# Patient Record
Sex: Male | Born: 1955 | Race: White | Hispanic: No | Marital: Married | Smoking: Former smoker
Health system: Southern US, Community
[De-identification: ages and names within clinical notes are randomized; demographics above are authoritative.]

## PROBLEM LIST (undated history)

## (undated) DIAGNOSIS — G4733 Obstructive sleep apnea (adult) (pediatric): Secondary | ICD-10-CM

## (undated) DIAGNOSIS — E669 Obesity, unspecified: Secondary | ICD-10-CM

## (undated) DIAGNOSIS — I779 Disorder of arteries and arterioles, unspecified: Secondary | ICD-10-CM

## (undated) DIAGNOSIS — N529 Male erectile dysfunction, unspecified: Secondary | ICD-10-CM

## (undated) DIAGNOSIS — I2510 Atherosclerotic heart disease of native coronary artery without angina pectoris: Secondary | ICD-10-CM

## (undated) DIAGNOSIS — K759 Inflammatory liver disease, unspecified: Secondary | ICD-10-CM

## (undated) DIAGNOSIS — I77810 Thoracic aortic ectasia: Secondary | ICD-10-CM

## (undated) DIAGNOSIS — J302 Other seasonal allergic rhinitis: Secondary | ICD-10-CM

## (undated) DIAGNOSIS — E7800 Pure hypercholesterolemia, unspecified: Secondary | ICD-10-CM

## (undated) DIAGNOSIS — M1990 Unspecified osteoarthritis, unspecified site: Secondary | ICD-10-CM

## (undated) DIAGNOSIS — I10 Essential (primary) hypertension: Secondary | ICD-10-CM

## (undated) HISTORY — DX: Male erectile dysfunction, unspecified: N52.9

## (undated) HISTORY — DX: Obesity, unspecified: E66.9

## (undated) HISTORY — PX: OTHER SURGICAL HISTORY: SHX169

## (undated) HISTORY — DX: Thoracic aortic ectasia: I77.810

## (undated) HISTORY — DX: Atherosclerotic heart disease of native coronary artery without angina pectoris: I25.10

## (undated) HISTORY — DX: Obstructive sleep apnea (adult) (pediatric): G47.33

---
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
## 1996-03-15 HISTORY — PX: NASAL SEPTUM SURGERY: SHX37

## 1997-03-15 HISTORY — PX: CERVICAL FUSION: SHX112

## 1998-10-17 ENCOUNTER — Encounter: Payer: Self-pay | Admitting: Emergency Medicine

## 1998-10-17 ENCOUNTER — Emergency Department (HOSPITAL_COMMUNITY): Admission: EM | Admit: 1998-10-17 | Discharge: 1998-10-17 | Payer: Self-pay | Admitting: Emergency Medicine

## 1999-02-26 ENCOUNTER — Ambulatory Visit (HOSPITAL_COMMUNITY): Admission: RE | Admit: 1999-02-26 | Discharge: 1999-02-26 | Payer: Self-pay | Admitting: Family Medicine

## 1999-02-26 ENCOUNTER — Encounter: Payer: Self-pay | Admitting: Family Medicine

## 1999-03-30 ENCOUNTER — Encounter: Admission: RE | Admit: 1999-03-30 | Discharge: 1999-03-30 | Payer: Self-pay | Admitting: Family Medicine

## 1999-03-30 ENCOUNTER — Encounter: Payer: Self-pay | Admitting: Family Medicine

## 1999-06-17 ENCOUNTER — Inpatient Hospital Stay (HOSPITAL_COMMUNITY): Admission: RE | Admit: 1999-06-17 | Discharge: 1999-06-17 | Payer: Self-pay | Admitting: Neurosurgery

## 1999-06-17 ENCOUNTER — Encounter: Payer: Self-pay | Admitting: Neurosurgery

## 1999-07-09 ENCOUNTER — Ambulatory Visit (HOSPITAL_COMMUNITY): Admission: RE | Admit: 1999-07-09 | Discharge: 1999-07-09 | Payer: Self-pay | Admitting: Neurosurgery

## 1999-07-09 ENCOUNTER — Encounter: Payer: Self-pay | Admitting: Neurosurgery

## 2002-12-14 ENCOUNTER — Ambulatory Visit (HOSPITAL_COMMUNITY): Admission: RE | Admit: 2002-12-14 | Discharge: 2002-12-14 | Payer: Self-pay | Admitting: Gastroenterology

## 2012-04-10 ENCOUNTER — Encounter (HOSPITAL_COMMUNITY): Payer: Self-pay | Admitting: *Deleted

## 2012-04-10 NOTE — Pre-Procedure Instructions (Signed)
Your procedure is scheduled on:Wednesdat, 05, 2014 Report to [facility] Admitting at: Call this number if you have problems morning of your procedure:[phone]  Follow all bowel prep instructions per your doctor's orders.  Do not eat or drink anything after midnight the night before your procedure. You may brush your teeth, rinse out your mouth, but no water, no food, no chewing gum, no mints, no candies, no chewing tobacco.     Take these medicines the morning of your procedure with A SIP OF WATER:nasall spray for allergy  Please make arrangements for a responsible person to drive you home after the procedure. You cannot go home by cab/taxi. We recommend you have someone with you at home the first 24 hours after your procedure. Driver for procedure is wife Toniann Fail [phone]  LEAVE ALL VALUABLES, JEWELRY, BILLFOLD AT HOME.  NO DENTURES, CONTACT LENSES ALLOWED IN THE ENDOSCOPY ROOM.   YOU MAY WEAR DEODORANT, PLEASE REMOVE ALL JEWELRY, WATCHES RINGS, BODY PIERCINGS AND LEAVE AT HOME.   WOMEN: NO MAKE-UP, LOTIONS PERFUMES

## 2012-04-18 ENCOUNTER — Encounter (HOSPITAL_COMMUNITY): Payer: Self-pay | Admitting: Pharmacy Technician

## 2012-04-19 ENCOUNTER — Ambulatory Visit (HOSPITAL_COMMUNITY): Payer: BC Managed Care – PPO | Admitting: Anesthesiology

## 2012-04-19 ENCOUNTER — Encounter (HOSPITAL_COMMUNITY): Payer: Self-pay | Admitting: *Deleted

## 2012-04-19 ENCOUNTER — Encounter (HOSPITAL_COMMUNITY): Payer: Self-pay | Admitting: Anesthesiology

## 2012-04-19 ENCOUNTER — Encounter (HOSPITAL_COMMUNITY)
Admission: RE | Disposition: A | Discharge status: Home / Self Care | Payer: Self-pay | Source: Ambulatory Visit | Attending: Gastroenterology

## 2012-04-19 ENCOUNTER — Ambulatory Visit (HOSPITAL_COMMUNITY)
Admission: RE | Admit: 2012-04-19 | Discharge: 2012-04-19 | Disposition: A | Discharge status: Home / Self Care | Payer: BC Managed Care – PPO | Source: Ambulatory Visit | Attending: Gastroenterology | Admitting: Gastroenterology

## 2012-04-19 DIAGNOSIS — Z800 Family history of malignant neoplasm of digestive organs: Secondary | ICD-10-CM | POA: Insufficient documentation

## 2012-04-19 DIAGNOSIS — D126 Benign neoplasm of colon, unspecified: Secondary | ICD-10-CM | POA: Insufficient documentation

## 2012-04-19 DIAGNOSIS — K648 Other hemorrhoids: Secondary | ICD-10-CM | POA: Insufficient documentation

## 2012-04-19 DIAGNOSIS — K644 Residual hemorrhoidal skin tags: Secondary | ICD-10-CM | POA: Insufficient documentation

## 2012-04-19 HISTORY — DX: Pure hypercholesterolemia, unspecified: E78.00

## 2012-04-19 HISTORY — PX: HOT HEMOSTASIS: SHX5433

## 2012-04-19 HISTORY — DX: Other seasonal allergic rhinitis: J30.2

## 2012-04-19 HISTORY — DX: Inflammatory liver disease, unspecified: K75.9

## 2012-04-19 HISTORY — PX: COLONOSCOPY WITH PROPOFOL: SHX5780

## 2012-04-19 SURGERY — COLONOSCOPY WITH PROPOFOL
Anesthesia: Monitor Anesthesia Care

## 2012-04-19 MED ORDER — PROPOFOL INFUSION 10 MG/ML OPTIME
INTRAVENOUS | Status: DC | PRN
  Administered 2012-04-19: 180 ug/kg/min via INTRAVENOUS

## 2012-04-19 MED ORDER — SODIUM CHLORIDE 0.9 % IV SOLN: INTRAVENOUS | Status: DC

## 2012-04-19 MED ORDER — MIDAZOLAM HCL 5 MG/5ML IJ SOLN
INTRAMUSCULAR | Status: DC | PRN
  Administered 2012-04-19: 2 mg via INTRAVENOUS

## 2012-04-19 MED ORDER — FENTANYL CITRATE 0.05 MG/ML IJ SOLN
INTRAMUSCULAR | Status: DC | PRN
  Administered 2012-04-19 (×2): 50 ug via INTRAVENOUS

## 2012-04-19 MED ORDER — LACTATED RINGERS IV SOLN
INTRAVENOUS | Status: DC
  Administered 2012-04-19: 1000 mL via INTRAVENOUS

## 2012-04-19 SURGICAL SUPPLY — 21 items

## 2012-04-19 NOTE — Anesthesia Preprocedure Evaluation (Signed)
Anesthesia Evaluation  Patient identified by MRN, date of birth, ID band Patient awake    Reviewed: Allergy & Precautions, H&P , NPO status , Patient's Chart, lab work & pertinent test results, reviewed documented beta blocker date and time   Airway Mallampati: II TM Distance: >3 FB Neck ROM: full    Dental No notable dental hx.    Pulmonary neg pulmonary ROS,  breath sounds clear to auscultation  Pulmonary exam normal       Cardiovascular Exercise Tolerance: Good negative cardio ROS  Rhythm:regular Rate:Normal     Neuro/Psych negative neurological ROS  negative psych ROS   GI/Hepatic negative GI ROS, (+) Hepatitis -  Endo/Other  negative endocrine ROS  Renal/GU negative Renal ROS  negative genitourinary   Musculoskeletal   Abdominal   Peds  Hematology negative hematology ROS (+)   Anesthesia Other Findings   Reproductive/Obstetrics negative OB ROS                           Anesthesia Physical Anesthesia Plan  ASA: II  Anesthesia Plan: MAC   Post-op Pain Management:    Induction: Intravenous  Airway Management Planned:   Additional Equipment:   Intra-op Plan:   Post-operative Plan: Extubation in OR  Informed Consent: I have reviewed the patients History and Physical, chart, labs and discussed the procedure including the risks, benefits and alternatives for the proposed anesthesia with the patient or authorized representative who has indicated his/her understanding and acceptance.   Dental Advisory Given  Plan Discussed with: CRNA  Anesthesia Plan Comments:         Anesthesia Quick Evaluation

## 2012-04-19 NOTE — Op Note (Signed)
[facility] [address] [place], [address]   COLONOSCOPY PROCEDURE REPORT  PATIENT: Daniel Humphrey, Daniel Humphrey  MR#: [id number] BIRTHDATE: 09-24-1955 , 56  yrs. old GENDER: Male ENDOSCOPIST: [name], MD REFERRED HQ:[id number] Harris, M.D. PROCEDURE DATE:  04/19/2012 PROCEDURE:   Colonoscopy with tissue ablation, Colonoscopy with biopsy and snare polypectomy, and Colonoscopy with hot biopsy/bipolar ASA CLASS:   Class I INDICATIONS:Patient's immediate family history of colon cancer and Patient's personal history of adenomatous colon polyps difficult to remove. MEDICATIONS: propofol (Diprivan) 550mg  IV, Fentanyl 100 mcg IV, and Versed 2 mg IV  DESCRIPTION OF PROCEDURE:   After the risks benefits and alternatives of the procedure were thoroughly explained, informed consent was obtained.  The [id number])  endoscope was introduced through the anus and advanced to the cecum, which was identified by both the appendix and ileocecal valve , limited by No adverse events experienced.   The quality of the prep was adequate. .  The instrument was then slowly withdrawn as the colon was fully examined.to advanced to the cecum required some abdominal pressure and no obvious abnormality was seen on insertion and one fold behind the ileocecal valve some residual polyp was seen and was snared x3 and then hot biopsied x4 and then we used the APC in the customary fashion without any obvious residual polyp seen. On the ileocecal valve was probably a hyperplastic polyp which was cold biopsied x4 and put in a separate container and the scope was then slowly withdrawn and no additional findings was seen and once back in the rectum anal rectal pull-through in retroflexion was done and the scope was straightened and advanced a short ways up the left side of the colon air was suctioned scope removed patient tolerated the procedure well there was no obvious immediate  complication      FINDINGS:  1 . Internal/external small hemorrhoids 2. Minimal residual ascending proximal polyp status post snare hot biopsy and APC 3. Small ileocecal valve probable hyperplastic polyp status post cold biopsy 4. Otherwise within normal limits to the cecum COMPLICATIONS: none  IMPRESSION:  above  RECOMMENDATIONS: hold aspirin for one week await pathology call when necessary repeat colon screening pending pathology probably 3 years unless all pathology hyperplastic in which case okay to wait 5 year   _______________________________ eSigned:  [name], MD 04/19/2012 9:55 AM   DG:[id number] Tiburcio Pea, MD  PATIENT NAME:  Daniel Humphrey MR#: [id number]

## 2012-04-19 NOTE — Anesthesia Postprocedure Evaluation (Signed)
  Anesthesia Post-op Note  Patient: Daniel Humphrey  Procedure(s) Performed: Procedure(s) (LRB): COLONOSCOPY WITH PROPOFOL (N/A) HOT HEMOSTASIS (ARGON PLASMA COAGULATION/BICAP) (N/A)  Patient Location: PACU  Anesthesia Type: MAC  Level of Consciousness: awake and alert   Airway and Oxygen Therapy: Patient Spontanous Breathing  Post-op Pain: mild  Post-op Assessment: Post-op Vital signs reviewed, Patient's Cardiovascular Status Stable, Respiratory Function Stable, Patent Airway and No signs of Nausea or vomiting  Last Vitals:  Filed Vitals:   04/19/12 1020  BP: 134/95  Pulse:   Temp:   Resp:     Post-op Vital Signs: stable   Complications: No apparent anesthesia complications

## 2012-04-19 NOTE — Progress Notes (Signed)
Daniel Humphrey 8:43 AM  Subjective: Patient without any new GI or medical problems or complaints since we've seen him Objective: Vital signs stable afebrile no acute distress exam please see pre-assessment evaluation Assessment: Difficult to remove colonic polyp Plan: Okay to proceed with colonoscopy today  Daniel Humphrey E

## 2012-04-19 NOTE — Transfer of Care (Signed)
Immediate Anesthesia Transfer of Care Note  Patient: Daniel Humphrey  Procedure(s) Performed: Procedure(s) (LRB) with comments: COLONOSCOPY WITH PROPOFOL (N/A) HOT HEMOSTASIS (ARGON PLASMA COAGULATION/BICAP) (N/A)  Patient Location: PACU  Anesthesia Type:MAC  Level of Consciousness: awake, alert , oriented and patient cooperative  Airway & Oxygen Therapy: Patient Spontanous Breathing and Patient connected to face mask oxygen  Post-op Assessment: Report given to PACU RN and Post -op Vital signs reviewed and stable  Post vital signs: Reviewed and stable  Complications: No apparent anesthesia complications

## 2012-04-20 ENCOUNTER — Encounter (HOSPITAL_COMMUNITY): Payer: Self-pay | Admitting: Gastroenterology

## 2012-12-11 ENCOUNTER — Other Ambulatory Visit: Payer: Self-pay | Admitting: Interventional Cardiology

## 2012-12-11 DIAGNOSIS — I359 Nonrheumatic aortic valve disorder, unspecified: Secondary | ICD-10-CM

## 2012-12-11 DIAGNOSIS — R0602 Shortness of breath: Secondary | ICD-10-CM

## 2012-12-21 ENCOUNTER — Ambulatory Visit (HOSPITAL_COMMUNITY): Payer: BC Managed Care – PPO | Attending: Cardiovascular Disease | Admitting: Radiology

## 2012-12-21 VITALS — BP 143/81 | HR 71 | Ht 69.0 in | Wt 216.0 lb

## 2012-12-21 DIAGNOSIS — R5381 Other malaise: Secondary | ICD-10-CM | POA: Insufficient documentation

## 2012-12-21 DIAGNOSIS — R002 Palpitations: Secondary | ICD-10-CM | POA: Insufficient documentation

## 2012-12-21 DIAGNOSIS — R42 Dizziness and giddiness: Secondary | ICD-10-CM | POA: Insufficient documentation

## 2012-12-21 DIAGNOSIS — R0602 Shortness of breath: Secondary | ICD-10-CM

## 2012-12-21 DIAGNOSIS — R0609 Other forms of dyspnea: Secondary | ICD-10-CM | POA: Insufficient documentation

## 2012-12-21 DIAGNOSIS — R9439 Abnormal result of other cardiovascular function study: Secondary | ICD-10-CM

## 2012-12-21 DIAGNOSIS — R0600 Dyspnea, unspecified: Secondary | ICD-10-CM

## 2012-12-21 DIAGNOSIS — R0989 Other specified symptoms and signs involving the circulatory and respiratory systems: Secondary | ICD-10-CM | POA: Insufficient documentation

## 2012-12-21 MED ORDER — REGADENOSON 0.4 MG/5ML IV SOLN
0.4000 mg | Freq: Once | INTRAVENOUS | Status: AC
  Administered 2012-12-21: 0.4 mg via INTRAVENOUS

## 2012-12-21 MED ORDER — TECHNETIUM TC 99M SESTAMIBI GENERIC - CARDIOLITE
30.0000 | Freq: Once | INTRAVENOUS | Status: AC | PRN
  Administered 2012-12-21: 30 via INTRAVENOUS

## 2012-12-21 MED ORDER — TECHNETIUM TC 99M SESTAMIBI GENERIC - CARDIOLITE
10.0000 | Freq: Once | INTRAVENOUS | Status: AC | PRN
  Administered 2012-12-21: 10 via INTRAVENOUS

## 2012-12-21 NOTE — Progress Notes (Signed)
[facility] NUCLEAR MED [address] [place], [place] [address] [phone]    Cardiology Nuclear Med Yehuda Savannah Daniel Humphrey is a 57 y.o. male     MRN : [id number]     DOB: 02/01/1956  Procedure Date: 12/21/2012  Nuclear Med Background Indication for Stress Test:  Evaluation for Ischemia and Abnormal GXT History:  12/07/12 Echo:EF=60%; 12/08/12 OZH:[id number] at end of recovery Cardiac Risk Factors: Family History - CAD, History of Smoking and Lipids  Symptoms:  Dizziness, DOE, Fatigue and Palpitations   Nuclear Pre-Procedure Caffeine/Decaff Intake:  None NPO After: 7:00am   Lungs:  Clear. O2 Sat: 96% on room air. IV 0.9% NS with Angio Cath:  22g  IV Site: R Antecubital  IV Started by:  Bonnita Levan, RN  Chest Size (in):  44 Cup Size: n/a  Height: 5\' 9"  (1.753 m)  Weight:  216 lb (97.977 kg)  BMI:  Body mass index is 31.88 kg/(m^2). Tech Comments:  N/A    Nuclear Med Study 1 or 2 day study: 1 day  Stress Test Type:  Lexiscan  Reading MD: Lance Muss, MD  Order Authorizing Provider:  Lance Muss, MD  Resting Radionuclide: Technetium 19m Sestamibi  Resting Radionuclide Dose: 11.0 mCi   Stress Radionuclide:  Technetium 27m Sestamibi  Stress Radionuclide Dose: 33.0 mCi           Stress Protocol Rest HR: 71 Stress HR: 126  Rest BP: 143/81 Stress BP: 157/75  Exercise Time (min): 2:00 METS: n/a   Predicted Max HR: 165 bpm % Max HR: 76.36 bpm Rate Pressure Product: 96295   Dose of Adenosine (mg):  n/a Dose of Lexiscan: 0.4 mg  Dose of Atropine (mg): n/a Dose of Dobutamine: n/a mcg/kg/min (at max HR)  Stress Test Technologist: Smiley Houseman, CMA-N  Nuclear Technologist:  Domenic Polite, CNMT     Rest Procedure:  Myocardial perfusion imaging was performed at rest 45 minutes following the intravenous administration of Technetium 39m Sestamibi.  Rest ECG: NSR - Normal EKG  Stress Procedure:  The patient received IV Lexiscan 0.4 mg over  15-seconds with concurrent low level exercise and then Technetium 25m Sestamibi was injected at 30-seconds while the patient continued walking one more minute.  Quantitative spect images were obtained after a 45-minute delay.  Stress ECG: Significant ST abnormalities consistent with ischemia.  QPS Raw Data Images:  Mild diaphragmatic attenuation; normal left ventricular size. Stress Images:  Normal homogeneous uptake in all areas of the myocardium. Rest Images:  Comparison with the stress images reveals no significant change. Subtraction (SDS):  No evidence of ischemia. Transient Ischemic Dilatation (Normal <1.22):  n/a Lung/Heart Ratio (Normal <0.45):  0.34  Quantitative Gated Spect Images QGS EDV:  117 ml QGS ESV:  54 ml  Impression Exercise Capacity:  Lexiscan with low level exercise. BP Response:  Normal blood pressure response. Clinical Symptoms:  There is dyspnea. ECG Impression:  Significant ST abnormalities consistent with ischemia.Inferior leads. Likely false positive ECG. Comparison with Prior Nuclear Study: No previous nuclear study performed  Overall Impression:  Normal stress nuclear study.  LV Ejection Fraction: 54%.  LV Wall Motion:  NL LV Function; NL Wall Motion   [name] MD, [facility]

## 2013-06-18 ENCOUNTER — Ambulatory Visit
Admission: RE | Admit: 2013-06-18 | Discharge: 2013-06-18 | Disposition: A | Discharge status: Home / Self Care | Payer: BC Managed Care – PPO | Source: Ambulatory Visit | Attending: Interventional Cardiology | Admitting: Interventional Cardiology

## 2013-06-18 DIAGNOSIS — R0602 Shortness of breath: Secondary | ICD-10-CM

## 2013-06-18 DIAGNOSIS — I359 Nonrheumatic aortic valve disorder, unspecified: Secondary | ICD-10-CM

## 2013-06-18 MED ORDER — GADOBENATE DIMEGLUMINE 529 MG/ML IV SOLN
20.0000 mL | Freq: Once | INTRAVENOUS | Status: AC | PRN
  Administered 2013-06-18: 20 mL via INTRAVENOUS

## 2013-06-22 ENCOUNTER — Telehealth: Payer: Self-pay | Admitting: Interventional Cardiology

## 2013-06-22 DIAGNOSIS — I77810 Thoracic aortic ectasia: Secondary | ICD-10-CM

## 2013-06-22 NOTE — Telephone Encounter (Signed)
New message    Pt want chest CT results.  He had it at [facility].  He thinks it was a CT.

## 2013-06-22 NOTE — Telephone Encounter (Signed)
Lm letting pt know that I am waiting on final interpretation of MRA from Dr. Irish Lack and once I have final interp I will call him.

## 2013-06-22 NOTE — Telephone Encounter (Signed)
To Dr. Varanasi, please advise.  

## 2013-06-26 NOTE — Telephone Encounter (Signed)
See results note for MRA.

## 2013-06-26 NOTE — Addendum Note (Signed)
Addended byUlla Potash H on: 06/26/2013 08:24 AM   Modules accepted: Orders

## 2013-12-26 ENCOUNTER — Ambulatory Visit (HOSPITAL_COMMUNITY): Admission: RE | Admit: 2013-12-26 | Payer: BC Managed Care – PPO | Source: Ambulatory Visit

## 2013-12-26 ENCOUNTER — Telehealth: Payer: Self-pay | Admitting: Interventional Cardiology

## 2013-12-26 NOTE — Addendum Note (Signed)
Addended byUlla Potash H on: 12/26/2013 01:07 PM   Modules accepted: Orders

## 2013-12-26 NOTE — Telephone Encounter (Signed)
Spoke with pt and he was unaware of his MRA Chest appointment today. He has appt with Dr. Irish Lack tomorrow and we will reschedule then.

## 2013-12-26 NOTE — Telephone Encounter (Signed)
lmtrc

## 2013-12-26 NOTE — Telephone Encounter (Signed)
New message          Pt would like amy to give him a call / pt did not disclose any other info

## 2013-12-27 ENCOUNTER — Encounter: Payer: Self-pay | Admitting: Interventional Cardiology

## 2013-12-27 ENCOUNTER — Ambulatory Visit (INDEPENDENT_AMBULATORY_CARE_PROVIDER_SITE_OTHER): Payer: BC Managed Care – PPO | Admitting: Interventional Cardiology

## 2013-12-27 VITALS — BP 140/90 | HR 75 | Ht 70.0 in | Wt 216.4 lb

## 2013-12-27 DIAGNOSIS — Z8249 Family history of ischemic heart disease and other diseases of the circulatory system: Secondary | ICD-10-CM | POA: Insufficient documentation

## 2013-12-27 DIAGNOSIS — I77810 Thoracic aortic ectasia: Secondary | ICD-10-CM

## 2013-12-27 DIAGNOSIS — E785 Hyperlipidemia, unspecified: Secondary | ICD-10-CM

## 2013-12-27 NOTE — Patient Instructions (Addendum)
Your physician recommends that you continue on your current medications as directed. Please refer to the Current Medication list given to you today.  Your physician has requested that you have a cardiac MRI in 6 months. Cardiac MRI uses a computer to create images of your heart as its beating, producing both still and moving pictures of your heart and major blood vessels. For further information please visit [email or web address] Please follow the instruction sheet given to you today for more information.  Your physician wants you to follow-up in: 1 year with Dr. Irish Lack. You will receive a reminder letter in the mail two months in advance. If you don't receive a letter, please call our office to schedule the follow-up appointment.

## 2013-12-27 NOTE — Progress Notes (Signed)
Patient ID: Daniel Humphrey, male   DOB: Sep 19, 1955, 58 y.o.   MRN: [id number]    Daniel Humphrey, Daniel Humphrey, Daniel Humphrey  [address] Phone: [phone] Fax:  [phone]  Date:  12/27/2013   ID:  Daniel Humphrey, DOB [birth year], MRN [id number]  PCP:  Pcp Not In System      History of Present Illness: Daniel Humphrey is a 58 y.o. male who has had a strong family h/o CAD. Three older brothers with stents or CABG or MI. He walks a lot at work and has some DOE with walking stairs- if 3 or more flights. Worse with weight gain. He has some occasional SHOB with walking to the bathroom in the middle of the night. Wife reports some sx of OSA. Intermittent snoring. He has some fatigue in the morning. Negative nuclear study in 10/14. Hyperlipidemia:  c/o Shortness of breath exacerbated by activity.  c/o Dizziness.  Denies : Chest pain.  Palpitations.  Leg edema.  Orthopnea.     Wt Readings from Last 3 Encounters:  12/27/13 216 lb 6.4 oz (98.158 kg)  12/21/12 216 lb (97.977 kg)  04/19/12 214 lb (97.07 kg)     Past Medical History  Diagnosis Date  . Seasonal allergies   . High cholesterol   . Hepatitis 1975    A viral  . ED (erectile dysfunction)   . Aortic root dilation     Current Outpatient Prescriptions  Medication Sig Dispense Refill  . aspirin 81 MG tablet Take 81 mg by mouth daily.      . cetirizine-pseudoephedrine (ZYRTEC-D) 5-120 MG per tablet Take 1 tablet by mouth at bedtime.      . fluticasone (FLONASE) 50 MCG/ACT nasal spray Place 1 spray into the nose daily.      . Multiple Vitamin (MULTIVITAMIN WITH MINERALS) TABS Take 1 tablet by mouth daily.      . simvastatin (ZOCOR) 10 MG tablet Take 10 mg by mouth daily before breakfast.      . cetirizine (ZYRTEC) 10 MG tablet Take 10 mg by mouth daily.       No current facility-administered medications for this visit.    Allergies:   No Known Allergies  Social History:  The patient  reports that he quit smoking about  26 years ago. He has never used smokeless tobacco. He reports that he drinks alcohol. He reports that he does not use illicit drugs.   Family History:  The patient's family history includes Heart attack in his brother, brother, father, and mother; Heart disease in his brother and brother; Hyperlipidemia in his father and mother; Hypertension in his sister.   ROS:  Please see the history of present illness.  No nausea, vomiting.  No fevers, chills.  No focal weakness.  No dysuria.    All other systems reviewed and negative.   PHYSICAL EXAM: VS:  BP 140/90  Pulse 75  Ht 5\' 10"  (1.778 m)  Wt 216 lb 6.4 oz (98.158 kg)  BMI 31.05 kg/m2 Well nourished, well developed, in no acute distress HEENT: normal Neck: no JVD, no carotid bruits Cardiac:  normal S1, S2; RRR;  Lungs:  clear to auscultation bilaterally, no wheezing, rhonchi or rales Abd: soft, nontender, no hepatomegaly Ext: no edema Skin: warm and dry Neuro:   no focal abnormalities noted  EKG:  Normal     ASSESSMENT AND PLAN:  Family history of chronic ischemic heart disease     12/08/2012 ETT > 7:00  on the treadmill. Mild shortness of exertion. No ischemia during exercise. At the end of recovery, there 1 mm ST segment depressions inferiorly. Normal heart rate recovery. Normal blood pressure response. We'll review echocardiogram. Consider nuclear stress test given his strong family history of coronary artery disease. Patient informed. Stegall,Amy 12/11/2012 09:14:27 AM > lmtrc Stegall,Amy 12/11/2012 10:10:01 AM > Pt notified. Pt would like to go ahead with Nuclear Stress    Notes: Strong family history of early CAD. Nuclear stress test in 10/14 was negative for ischemia. 2. Shortness of breath  Intermittent DOE with strenuous exercise. Continue to try for weight loss, healthy diet and exercise to increse stamina.  No significant change in sx.  If sx get worse, let us know. Normal EF by echo in 10/14.     Notes: Mild. Evaluate for  structural heart disease.  3. Combined hyperlipidemia  Continue Simvastatin Tablet, 10 MG, TAKE 1 TABLET BY MOUTH EVERY DAY, due OV and labs, daily LDL 110 in 4/15. TG 191.    Dilated aortic root: Daniel/u MRA.  If stable , would change to annual MRA to check ascending aorta.     Signed, Mina Marble, MD, [facility] 12/27/2013 9:43 AM

## 2013-12-28 ENCOUNTER — Other Ambulatory Visit: Payer: Self-pay | Admitting: *Deleted

## 2013-12-28 DIAGNOSIS — I77810 Thoracic aortic ectasia: Secondary | ICD-10-CM

## 2014-01-02 ENCOUNTER — Encounter: Payer: Self-pay | Admitting: *Deleted

## 2014-06-17 ENCOUNTER — Telehealth: Payer: Self-pay | Admitting: Interventional Cardiology

## 2014-06-17 ENCOUNTER — Ambulatory Visit (HOSPITAL_COMMUNITY)
Admission: RE | Admit: 2014-06-17 | Discharge: 2014-06-17 | Disposition: A | Discharge status: Home / Self Care | Payer: BLUE CROSS/BLUE SHIELD | Source: Ambulatory Visit | Attending: Interventional Cardiology | Admitting: Interventional Cardiology

## 2014-06-17 DIAGNOSIS — I712 Thoracic aortic aneurysm, without rupture: Secondary | ICD-10-CM | POA: Insufficient documentation

## 2014-06-17 DIAGNOSIS — I77810 Thoracic aortic ectasia: Secondary | ICD-10-CM | POA: Insufficient documentation

## 2014-06-17 LAB — CREATININE, SERUM
Creatinine, Ser: 0.88 mg/dL (ref 0.50–1.35)
GFR calc non Af Amer: >90 mL/min (ref >90)

## 2014-06-17 MED ORDER — GADOBENATE DIMEGLUMINE 529 MG/ML IV SOLN
20.0000 mL | Freq: Once | INTRAVENOUS | Status: AC
  Administered 2014-06-17: 20 mL via INTRAVENOUS

## 2014-06-17 NOTE — Telephone Encounter (Signed)
Informed pt of lab results. Pt verbalized understanding. 

## 2014-06-17 NOTE — Telephone Encounter (Signed)
Left message to call back  

## 2014-06-17 NOTE — Telephone Encounter (Signed)
Follow up ° ° ° ° ° °Returning [name] call °

## 2014-06-17 NOTE — Telephone Encounter (Signed)
PT RTN CALL TO JENNIFER RE RESULTS--PLS CALL

## 2014-06-28 ENCOUNTER — Other Ambulatory Visit: Payer: Self-pay | Admitting: *Deleted

## 2014-06-28 DIAGNOSIS — I77810 Thoracic aortic ectasia: Secondary | ICD-10-CM

## 2014-07-01 ENCOUNTER — Ambulatory Visit (HOSPITAL_COMMUNITY): Payer: BC Managed Care – PPO

## 2014-10-31 ENCOUNTER — Encounter: Payer: Self-pay | Admitting: Interventional Cardiology

## 2015-01-08 ENCOUNTER — Encounter: Payer: Self-pay | Admitting: Interventional Cardiology

## 2015-01-08 ENCOUNTER — Ambulatory Visit (INDEPENDENT_AMBULATORY_CARE_PROVIDER_SITE_OTHER): Payer: BLUE CROSS/BLUE SHIELD | Admitting: Interventional Cardiology

## 2015-01-08 ENCOUNTER — Other Ambulatory Visit: Payer: Self-pay | Admitting: Gastroenterology

## 2015-01-08 VITALS — BP 138/86 | HR 74 | Ht 70.0 in | Wt 223.0 lb

## 2015-01-08 DIAGNOSIS — G4730 Sleep apnea, unspecified: Secondary | ICD-10-CM

## 2015-01-08 DIAGNOSIS — R0600 Dyspnea, unspecified: Secondary | ICD-10-CM

## 2015-01-08 DIAGNOSIS — I77810 Thoracic aortic ectasia: Secondary | ICD-10-CM

## 2015-01-08 DIAGNOSIS — Z8249 Family history of ischemic heart disease and other diseases of the circulatory system: Secondary | ICD-10-CM | POA: Diagnosis not present

## 2015-01-08 DIAGNOSIS — R0609 Other forms of dyspnea: Secondary | ICD-10-CM

## 2015-01-08 DIAGNOSIS — E785 Hyperlipidemia, unspecified: Secondary | ICD-10-CM

## 2015-01-08 NOTE — Patient Instructions (Addendum)
Medication Instructions:  Same-no changes  Labwork: None  Testing/Procedures: Your physician has recommended that you have a sleep study. This test records several body functions during sleep, including: brain activity, eye movement, oxygen and carbon dioxide blood levels, heart rate and rhythm, breathing rate and rhythm, the flow of air through your mouth and nose, snoring, body muscle movements, and chest and belly movement.   Follow-Up: Your physician wants you to follow-up in: 1 year. You will receive a reminder letter in the mail two months in advance. If you don't receive a letter, please call our office to schedule the follow-up appointment.    If you need a refill on your cardiac medications before your next appointment, please call your pharmacy.

## 2015-01-08 NOTE — Addendum Note (Signed)
Addended byClarene Essex on: 01/08/2015 12:10 PM   Modules accepted: Orders

## 2015-01-08 NOTE — Progress Notes (Signed)
Patient ID: Daniel Humphrey, male   DOB: February 04, 1956, 59 y.o.   MRN: [id number]      Cardiology Office Note   Date:  01/08/2015   ID:  Daniel Humphrey, DOB 1955-03-16, MRN [id number]  PCP:  Pcp Not In System    No chief complaint on file. Family h/o CAD   Wt Readings from Last 3 Encounters:  01/08/15 223 lb (101.152 kg)  12/27/13 216 lb 6.4 oz (98.158 kg)  12/21/12 216 lb (97.977 kg)       History of Present Illness: Daniel Humphrey is a 59 y.o. male  who has had a strong family h/o CAD. Three older brothers with stents or CABG or MI. He walks a lot at work and has some DOE with walking stairs- if 2 or more flights. Worse with weight gain. He has some occasional SHOB with walking to the bathroom in the middle of the night. Wife reported some sx of OSA. Intermittent snoring. He has some fatigue in the morning. Negative nuclear study in 10/14.  He has never been tested for sleep apnea.   Hyperlipidemia:  c/o Shortness of breath exacerbated by activity.  He has gained weight as well.  c/o Dizziness.  Denies : Chest pain.  Palpitations.  Leg edema.  Orthopnea.   He feels that his symptoms are related to weight gain. He denies any symptoms that are different from what he has had in the past when he has gained weight.  Past Medical History  Diagnosis Date  . Seasonal allergies   . High cholesterol   . Hepatitis 1975    A viral  . ED (erectile dysfunction)   . Aortic root dilation [facility])     Past Surgical History  Procedure Laterality Date  . Cervical fusion  1999  . Nasal septum surgery  1998  . Colonoscopy with propofol  04/19/2012    Procedure: COLONOSCOPY WITH PROPOFOL;  Surgeon: Jeryl Columbia, MD;  Location: [facility] ENDOSCOPY;  Service: Endoscopy;  Laterality: N/A;  . Hot hemostasis  04/19/2012    Procedure: HOT HEMOSTASIS (ARGON PLASMA COAGULATION/BICAP);  Surgeon: Jeryl Columbia, MD;  Location: [facility];  Service: Endoscopy;  Laterality: N/A;     Current Outpatient  Prescriptions  Medication Sig Dispense Refill  . aspirin 81 MG tablet Take 81 mg by mouth daily.    . cetirizine-pseudoephedrine (ZYRTEC-D) 5-120 MG per tablet Take 1 tablet by mouth at bedtime.    . fluticasone (FLONASE) 50 MCG/ACT nasal spray Place 1 spray into the nose daily.    . Multiple Vitamin (MULTIVITAMIN WITH MINERALS) TABS Take 1 tablet by mouth daily.    . simvastatin (ZOCOR) 10 MG tablet Take 10 mg by mouth daily before breakfast.     No current facility-administered medications for this visit.    Allergies:   Review of patient's allergies indicates no known allergies.    Social History:  The patient  reports that he quit smoking about 27 years ago. He has never used smokeless tobacco. He reports that he drinks alcohol. He reports that he does not use illicit drugs.   Family History:  The patient's family history includes Heart attack in his brother, brother, father, and mother; Heart disease in his brother and brother; Hyperlipidemia in his father and mother; Hypertension in his sister.    ROS:  Please see the history of present illness.   Otherwise, review of systems are positive for weight gain, SHOB, fatigue.   All other systems are  reviewed and negative.    PHYSICAL EXAM: VS:  BP 138/86 mmHg  Pulse 74  Ht 5\' 10"  (1.778 m)  Wt 223 lb (101.152 kg)  BMI 32.00 kg/m2 , BMI Body mass index is 32 kg/(m^2). GEN: Well nourished, well developed, in no acute distress HEENT: normal Neck: no JVD, carotid bruits, or masses Cardiac: RRR; no murmurs, rubs, or gallops,no edema  Respiratory:  clear to auscultation bilaterally, normal work of breathing GI: soft, nontender, nondistended, + BS MS: no deformity or atrophy Skin: warm and dry, no rash Neuro:  Strength and sensation are intact Psych: euthymic mood, full affect   EKG:   The ekg ordered today demonstrates normal ECG   Recent Labs: 06/17/2014: Creatinine, Ser 0.88   Lipid Panel No results found for: CHOL, TRIG,  HDL, CHOLHDL, VLDL, LDLCALC, LDLDIRECT   Other studies Reviewed: Additional studies/ records that were reviewed today with results demonstrating: MRI in April 2016 reviewed.   ASSESSMENT AND PLAN:  1. Family history of early coronary artery disease: He had a negative nuclear study in 2014. The ECG portion of the test was positive and this is what prompted the nuclear study. Continue aggressive preventative therapy. His lipids are managed by his primary care doctor. 2. Dilated aortic root: This is stable. MRI from April 2016 reviewed. Thoracic aorta at 4.2 cm. 3. Shortness of breath: He feels this is related to weight gain. If symptoms get worse or if any new symptoms arise, would have a low threshold for a repeat stress test. He would like to try for healthy diet and weight loss. If symptoms get better, then no further testing would be needed. He did have a normal ejection fraction by echo in October 2014. 4. Hyperlipidemia: Continue simvastatin. 5. Observed apnea: The patient states his wife continues to mention this as an issue. Given that he has felt more fatigued, will refer for sleep study.   Current medicines are reviewed at length with the patient today.  The patient concerns regarding his medicines were addressed.  The following changes have been made:  No change  Labs/ tests ordered today include:  No orders of the defined types were placed in this encounter.    Recommend 150 minutes/week of aerobic exercise Low fat, low carb, high fiber diet recommended  Disposition:   FU in 1 year   Teresita Madura., MD  01/08/2015 9:17 AM    [facility] [address], [place], [place]  [address] Phone: [phone]; Fax: [phone]

## 2015-01-13 ENCOUNTER — Encounter (HOSPITAL_COMMUNITY): Payer: Self-pay | Admitting: *Deleted

## 2015-01-17 ENCOUNTER — Ambulatory Visit (HOSPITAL_COMMUNITY): Payer: BLUE CROSS/BLUE SHIELD | Admitting: Anesthesiology

## 2015-01-17 ENCOUNTER — Encounter (HOSPITAL_COMMUNITY)
Admission: RE | Disposition: A | Discharge status: Home / Self Care | Payer: Self-pay | Source: Ambulatory Visit | Attending: Gastroenterology

## 2015-01-17 ENCOUNTER — Ambulatory Visit (HOSPITAL_COMMUNITY)
Admission: RE | Admit: 2015-01-17 | Discharge: 2015-01-17 | Disposition: A | Discharge status: Home / Self Care | Payer: BLUE CROSS/BLUE SHIELD | Source: Ambulatory Visit | Attending: Gastroenterology | Admitting: Gastroenterology

## 2015-01-17 ENCOUNTER — Encounter (HOSPITAL_COMMUNITY): Payer: Self-pay

## 2015-01-17 DIAGNOSIS — D120 Benign neoplasm of cecum: Secondary | ICD-10-CM | POA: Insufficient documentation

## 2015-01-17 DIAGNOSIS — E785 Hyperlipidemia, unspecified: Secondary | ICD-10-CM | POA: Diagnosis not present

## 2015-01-17 DIAGNOSIS — Z79899 Other long term (current) drug therapy: Secondary | ICD-10-CM | POA: Insufficient documentation

## 2015-01-17 DIAGNOSIS — D122 Benign neoplasm of ascending colon: Secondary | ICD-10-CM | POA: Insufficient documentation

## 2015-01-17 DIAGNOSIS — Z86010 Personal history of colonic polyps: Secondary | ICD-10-CM | POA: Insufficient documentation

## 2015-01-17 DIAGNOSIS — Z7982 Long term (current) use of aspirin: Secondary | ICD-10-CM | POA: Diagnosis not present

## 2015-01-17 DIAGNOSIS — D123 Benign neoplasm of transverse colon: Secondary | ICD-10-CM | POA: Diagnosis not present

## 2015-01-17 DIAGNOSIS — G4730 Sleep apnea, unspecified: Secondary | ICD-10-CM | POA: Diagnosis not present

## 2015-01-17 DIAGNOSIS — I739 Peripheral vascular disease, unspecified: Secondary | ICD-10-CM | POA: Insufficient documentation

## 2015-01-17 DIAGNOSIS — Z09 Encounter for follow-up examination after completed treatment for conditions other than malignant neoplasm: Secondary | ICD-10-CM | POA: Diagnosis present

## 2015-01-17 HISTORY — PX: HOT HEMOSTASIS: SHX5433

## 2015-01-17 HISTORY — PX: COLONOSCOPY WITH PROPOFOL: SHX5780

## 2015-01-17 SURGERY — COLONOSCOPY WITH PROPOFOL
Anesthesia: Monitor Anesthesia Care

## 2015-01-17 MED ORDER — LACTATED RINGERS IV SOLN
INTRAVENOUS | Status: DC
  Administered 2015-01-17: 1000 mL via INTRAVENOUS

## 2015-01-17 MED ORDER — PROPOFOL 10 MG/ML IV BOLUS
INTRAVENOUS | Status: AC
  Filled 2015-01-17: qty 20

## 2015-01-17 MED ORDER — LIDOCAINE HCL (CARDIAC) 20 MG/ML IV SOLN
INTRAVENOUS | Status: DC | PRN
  Administered 2015-01-17: 50 mg via INTRAVENOUS

## 2015-01-17 MED ORDER — PROPOFOL 500 MG/50ML IV EMUL
INTRAVENOUS | Status: DC | PRN
  Administered 2015-01-17: 200 ug/kg/min via INTRAVENOUS

## 2015-01-17 MED ORDER — GLYCOPYRROLATE 0.2 MG/ML IJ SOLN
INTRAMUSCULAR | Status: AC
  Filled 2015-01-17: qty 1

## 2015-01-17 MED ORDER — PROPOFOL 10 MG/ML IV BOLUS
INTRAVENOUS | Status: DC | PRN
  Administered 2015-01-17 (×2): 30 mg via INTRAVENOUS

## 2015-01-17 MED ORDER — SODIUM CHLORIDE 0.9 % IV SOLN: INTRAVENOUS | Status: DC

## 2015-01-17 MED ORDER — LIDOCAINE HCL (CARDIAC) 20 MG/ML IV SOLN
INTRAVENOUS | Status: AC
  Filled 2015-01-17: qty 5

## 2015-01-17 SURGICAL SUPPLY — 21 items

## 2015-01-17 NOTE — Discharge Instructions (Signed)
Call if question or problem otherwise call in 1 week for biopsy report and probably repeat colonoscopy in our office in 4-5 years and follow-up in the office as needed and hold aspirin and arthritis pills for 1 week unless needed per cardiologistColonoscopy, Care After These instructions give you information on caring for yourself after your procedure. Your doctor may also give you more specific instructions. Call your doctor if you have any problems or questions after your procedure. HOME CARE  Do not drive for 24 hours.  Do not sign important papers or use machinery for 24 hours.  You may shower.  You may go back to your usual activities, but go slower for the first 24 hours.  Take rest breaks often during the first 24 hours.  Walk around or use warm packs on your belly (abdomen) if you have belly cramping or gas.  Drink enough fluids to keep your pee (urine) clear or pale yellow.  Resume your normal diet. Avoid heavy or fried foods.  Avoid drinking alcohol for 24 hours or as told by your doctor.  Only take medicines as told by your doctor. If a tissue sample (biopsy) was taken during the procedure:   Do not take aspirin or blood thinners for 7 days, or as told by your doctor.  Do not drink alcohol for 7 days, or as told by your doctor.  Eat soft foods for the first 24 hours. GET HELP IF: You still have a small amount of blood in your poop (stool) 2-3 days after the procedure. GET HELP RIGHT AWAY IF:  You have more than a small amount of blood in your poop.  You see clumps of tissue (blood clots) in your poop.  Your belly is puffy (swollen).  You feel sick to your stomach (nauseous) or throw up (vomit).  You have a fever.  You have belly pain that gets worse and medicine does not help. MAKE SURE YOU:  Understand these instructions.  Will watch your condition.  Will get help right away if you are not doing well or get worse.   This information is not intended to  replace advice given to you by your health care provider. Make sure you discuss any questions you have with your health care provider.   Document Released: 04/03/2010 Document Revised: 03/06/2013 Document Reviewed: 11/06/2012 Elsevier Interactive Patient Education [employer].

## 2015-01-17 NOTE — Anesthesia Preprocedure Evaluation (Addendum)
Anesthesia Evaluation  Patient identified by MRN, date of birth, ID band Patient awake    Reviewed: Allergy & Precautions, NPO status , Patient's Chart, lab work & pertinent test results  Airway Mallampati: II  TM Distance: >3 FB Neck ROM: Full    Dental  (+) Teeth Intact   Pulmonary sleep apnea , former smoker,    breath sounds clear to auscultation       Cardiovascular + Peripheral Vascular Disease   Rhythm:Regular Rate:Normal     Neuro/Psych negative neurological ROS  negative psych ROS   GI/Hepatic negative GI ROS, (+) Hepatitis -, A  Endo/Other  negative endocrine ROS  Renal/GU   negative genitourinary   Musculoskeletal negative musculoskeletal ROS (+)   Abdominal   Peds negative pediatric ROS (+)  Hematology negative hematology ROS (+)   Anesthesia Other Findings   Reproductive/Obstetrics negative OB ROS                           EKG: normal EKG, normal sinus rhythm.  Anesthesia Physical Anesthesia Plan  ASA: II  Anesthesia Plan: MAC   Post-op Pain Management:    Induction: Intravenous  Airway Management Planned: Nasal Cannula and Natural Airway  Additional Equipment:   Intra-op Plan:   Post-operative Plan:   Informed Consent: I have reviewed the patients History and Physical, chart, labs and discussed the procedure including the risks, benefits and alternatives for the proposed anesthesia with the patient or authorized representative who has indicated his/her understanding and acceptance.   Dental advisory given  Plan Discussed with: CRNA  Anesthesia Plan Comments:         Anesthesia Quick Evaluation

## 2015-01-17 NOTE — Progress Notes (Signed)
Daniel Humphrey 8:21 AM  Subjective: Patient asymptomatic from a GI standpoint without any new medical problems since I seen him  Objective: Vital signs stable afebrile no acute distress exam please see preassessment evaluation  Assessment: Colon polyps due for repeat screening some difficult to remove  Plan: Okay to proceed with colonoscopy with anesthesia assistance  [facility]  Pager [phone] After 5PM or if no answer call [phone]

## 2015-01-17 NOTE — Transfer of Care (Signed)
Immediate Anesthesia Transfer of Care Note  Patient: Daniel Humphrey  Procedure(s) Performed: Procedure(s): COLONOSCOPY WITH PROPOFOL (N/A) HOT HEMOSTASIS (ARGON PLASMA COAGULATION/BICAP) (N/A)  Patient Location: PACU and Endoscopy Unit  Anesthesia Type:MAC  Level of Consciousness: awake, sedated, patient cooperative and responds to stimulation  Airway & Oxygen Therapy: Patient Spontanous Breathing and Patient connected to face mask oxygen  Post-op Assessment: Report given to RN and Post -op Vital signs reviewed and stable  Post vital signs: Reviewed and stable  Last Vitals:  Filed Vitals:   01/17/15 0724  BP: 153/82  Pulse: 75  Temp: 36.8 C  Resp: 20    Complications: No apparent anesthesia complications

## 2015-01-17 NOTE — Anesthesia Postprocedure Evaluation (Signed)
  Anesthesia Post-op Note  Patient: Daniel Humphrey  Procedure(s) Performed: Procedure(s): COLONOSCOPY WITH PROPOFOL (N/A) HOT HEMOSTASIS (ARGON PLASMA COAGULATION/BICAP) (N/A)  Patient Location: Endoscopy Unit  Anesthesia Type:MAC  Level of Consciousness: awake, alert  and oriented  Airway and Oxygen Therapy: Patient Spontanous Breathing  Post-op Pain: none  Post-op Assessment: Post-op Vital signs reviewed              Post-op Vital Signs: Reviewed  Last Vitals:  Filed Vitals:   01/17/15 0724  BP: 153/82  Pulse: 75  Temp: 36.8 C  Resp: 20    Complications: No apparent anesthesia complications

## 2015-01-17 NOTE — Op Note (Signed)
[facility] [place], [address]   COLONOSCOPY PROCEDURE REPORT     EXAM DATE: 01/17/2015  PATIENT NAME:      Daniel Humphrey, Daniel Humphrey           MR #:      #[id number]  BIRTHDATE:       1955-06-09      VISIT #:     [phone]  ATTENDING:     Clarene Essex, MD     STATUS:     outpatient ASSISTANT:      Elspeth Cho and Alinda Deem  INDICATIONS:  The patient is a 59 yr old male here for a colonoscopy due to high risk patient with personal history of colonic polyps ifficult to remove. PROCEDURE PERFORMED:     Colonoscopy with hot biopsy/bipolar Colonoscopy with snare polypectomy MEDICATIONS:     Propofol 800 mg IV  50 mg lidocaine ESTIMATED BLOOD LOSS:     None  CONSENT: The patient understands the risks and benefits of the procedure and understands that these risks include, but are not limited to: sedation, allergic reaction, infection, perforation and/or bleeding. Alternative means of evaluation and treatment include, among others: physical exam, x-rays, and/or surgical intervention. The patient elects to proceed with this endoscopic procedure.  DESCRIPTION OF PROCEDURE: During intra-op preparation period all mechanical & medical equipment was checked for proper function. Hand hygiene and appropriate measures for infection prevention was taken. After the risks, benefits and alternatives of the procedure were thoroughly explained, Informed consent was verified, confirmed and timeout was successfully executed by the treatment team. A digital exam revealed no abnormalities of the rectum. The Pentax Adult Colonscope [id number] endoscope was introduced through the anus and advanced to the cecum, which was identified by both the appendix and ileocecal valve. adequate The instrument was then slowly withdrawn as the colon was fully examined.Estimated blood loss is zero unless otherwise noted in this procedure report. he findings are recorded below       Retroflexed views revealed no abnormalities. The scope was then completely withdrawn from the patient and the procedure terminated. SCOPE WITHDRAWAL TIME: Per nurse's note    ADVERSE EVENTS:      There were no immediate complications.  IMPRESSIONS:     1.No obvious residual ascending colon polyp 2. Small ileocecal valve polyp andmid transverse polyp status post snare 3. 2 tiny moredistal ascending colon polyps hot biopsy 4. Otherwise within normal limits to the cecum  RECOMMENDATIONS:     await pathology probably repeat in 4-5 years in our officein GI follow-up sooner when necessary RECALL:     s needed or above  _____________________________ Clarene Essex, MD eSigned:  Clarene Essex, MD 01/17/2015 9:21 AM   cc:  Azalia Bilis, M.D.   CPT CODES: ICD CODES:  The ICD and CPT codes recommended by this software are interpretations from the data that the clinical staff has captured with the software.  The verification of the translation of this report to the ICD and CPT codes and modifiers is the sole responsibility of the health care institution and practicing physician where this report was generated.  [employer]. will not be held responsible for the validity of the ICD and CPT codes included on this report.  AMA assumes no liability for data contained or not contained herein. CPT is a Designer, television/film set of the [employer].   PATIENT NAME:  Daniel Humphrey MR#: #[id number]

## 2015-01-20 ENCOUNTER — Encounter (HOSPITAL_COMMUNITY): Payer: Self-pay | Admitting: Gastroenterology

## 2015-04-01 ENCOUNTER — Ambulatory Visit (HOSPITAL_BASED_OUTPATIENT_CLINIC_OR_DEPARTMENT_OTHER): Payer: BLUE CROSS/BLUE SHIELD | Attending: Interventional Cardiology

## 2015-04-01 DIAGNOSIS — G4733 Obstructive sleep apnea (adult) (pediatric): Secondary | ICD-10-CM | POA: Diagnosis not present

## 2015-04-01 DIAGNOSIS — Z79899 Other long term (current) drug therapy: Secondary | ICD-10-CM | POA: Diagnosis not present

## 2015-04-01 DIAGNOSIS — I491 Atrial premature depolarization: Secondary | ICD-10-CM | POA: Diagnosis not present

## 2015-04-01 DIAGNOSIS — I493 Ventricular premature depolarization: Secondary | ICD-10-CM | POA: Insufficient documentation

## 2015-04-01 DIAGNOSIS — R0683 Snoring: Secondary | ICD-10-CM | POA: Insufficient documentation

## 2015-04-01 DIAGNOSIS — G4730 Sleep apnea, unspecified: Secondary | ICD-10-CM

## 2015-04-15 ENCOUNTER — Encounter (HOSPITAL_BASED_OUTPATIENT_CLINIC_OR_DEPARTMENT_OTHER): Payer: Self-pay

## 2015-04-15 ENCOUNTER — Telehealth: Payer: Self-pay | Admitting: Cardiology

## 2015-04-15 DIAGNOSIS — G4733 Obstructive sleep apnea (adult) (pediatric): Secondary | ICD-10-CM | POA: Insufficient documentation

## 2015-04-15 HISTORY — DX: Obstructive sleep apnea (adult) (pediatric): G47.33

## 2015-04-15 NOTE — Telephone Encounter (Signed)
Please let patient know that they have significant sleep apnea and had successful CPAP titration and will be set up with CPAP unit.  Please let DME know that order is in EPIC.  Please set patient up for OV in 10 weeks 

## 2015-04-15 NOTE — Sleep Study (Signed)
Patient Name: Daniel Humphrey, Daniel Humphrey MRN: [id number] Study Date: 04/01/2015 Gender: Male D.O.B: 07-24-1955 Age (years): 8 Referring Provider: Larae Grooms Interpreting Physician: Fransico Him MD, ABSM RPSGT: Joni Reining  Weight (lbs): 218 BMI: 31 Height (inches): 70 Neck Size: 17.50  CLINICAL INFORMATION Sleep Study Type: Split Night CPAP Indication for sleep study: OSA Epworth Sleepiness Score: 8  SLEEP STUDY TECHNIQUE As per the AASM Manual for the Scoring of Sleep and Associated Events v2.3 (April 2016) with a hypopnea requiring 4% desaturations. The channels recorded and monitored were frontal, central and occipital EEG, electrooculogram (EOG), submentalis EMG (chin), nasal and oral airflow, thoracic and abdominal wall motion, anterior tibialis EMG, snore microphone, electrocardiogram, and pulse oximetry. Continuous positive airway pressure (CPAP) was initiated when the patient met split night criteria and was titrated according to treat sleep-disordered breathing.  MEDICATIONS Medications taken by the patient : ASA, Zyrtec, Flonase, Zocor Medications administered by patient during sleep study : No sleep medicine administered.  RESPIRATORY PARAMETERS Diagnostic Total AHI (/hr): 35.2  RDI (/hr):44.3  OA Index (/hr): 3.4  CA Index (/hr): 0.5 REM AHI (/hr): N/A  NREM AHI (/hr):35.2 Supine AHI (/hr):53.7  Non-supine AHI (/hr):0.00 Min O2 Sat (%):84.00  Mean O2 (%):92.88 Time below 88% (min):0.4    Titration Optimal Pressure (cm):9 AHI at Optimal Pressure (/hr):5.8 Min O2 at Optimal Pressure (%): 90.0 Supine % at Optimal (%):18 Sleep % at Optimal (%):86    SLEEP ARCHITECTURE The recording time for the entire night was 410.7 minutes. During a baseline period of 231.9 minutes, the patient slept for 124.5 minutes in REM and nonREM, yielding a reduced sleep efficiency of 53.7%. Sleep onset after lights out was 39.4 minutes with a REM latency of N/A minutes. The patient spent  27.71% of the night in stage N1 sleep, 72.29% in stage N2 sleep, 0.00% in stage N3 and 0.00% in REM.   During the titration period of 173.7 minutes, the patient slept for 132.5 minutes in REM and nonREM, yielding a reduced sleep efficiency of 76.3%. Sleep onset after CPAP initiation was 23.4 minutes with a REM latency of 43.5 minutes. The patient spent 8.68% of the night in stage N1 sleep, 60.00% in stage N2 sleep, 0.00% in stage N3 and 31.32% in REM.  CARDIAC DATA The 2 lead EKG demonstrated sinus rhythm. The mean heart rate was 79.97 beats per minute. Other EKG findings include: PVCs. and PACs.  LEG MOVEMENT DATA The total Periodic Limb Movements of Sleep (PLMS) were 12. The PLMS index was 2.80 .  IMPRESSIONS - Severe obstructive sleep apnea occurred during the diagnostic portion of the study (AHI = 35.2/hour). An optimal PAP pressure was selected for this patient ( 9 cm of water) - No significant central sleep apnea occurred during the diagnostic portion of the study (CAI = 0.5/hour). - Moderate oxygen desaturation was noted during the diagnostic portion of the study (Min O2 = 84.00%). - The patient snored with Moderate snoring volume during the diagnostic portion of the study. - EKG findings include PVCs and PACs. - Clinically significant periodic limb movements did not occur during sleep.  DIAGNOSIS - Obstructive Sleep Apnea (327.23 [G47.33 ICD-10])  RECOMMENDATIONS - Trial of CPAP therapy on 9 cm H2O with a Medium size Fisher&Paykel Nasal Mask Eson mask and heated humidification. - Avoid alcohol, sedatives and other CNS depressants that may worsen sleep apnea and disrupt normal sleep architecture. - Sleep hygiene should be reviewed to assess factors that may improve sleep quality. - Weight management and  regular exercise should be initiated or continued. - Return to Sleep Center for re-evaluation after 10 weeks of therapy   [place], American Board of Sleep  Medicine  ELECTRONICALLY SIGNED ON:  04/15/2015, 2:01 PM [place] PH: [phone]   FX: [phone] [place]

## 2015-04-15 NOTE — Addendum Note (Signed)
Addended by: Fransico Him R on: 04/15/2015 02:16 PM   Modules accepted: Orders

## 2015-04-18 NOTE — Telephone Encounter (Signed)
Left message for patient to call back  

## 2015-04-18 NOTE — Telephone Encounter (Signed)
Patient informed of results. Stated verbal understanding.   Patient agrees to treatment plan. [facility] has been notified of orders. Once he begins therapy, I will schedule 10 week follow-up.

## 2015-04-22 NOTE — Telephone Encounter (Signed)
Message from [facility] :  This patient is not wanting to proceed at this time with CPAP . He said his wife has an upcoming procedure.   Thanks

## 2015-04-22 NOTE — Telephone Encounter (Signed)
Please inform Dr. Radford Pax

## 2015-06-30 ENCOUNTER — Ambulatory Visit (HOSPITAL_COMMUNITY): Admission: RE | Admit: 2015-06-30 | Payer: BLUE CROSS/BLUE SHIELD | Source: Ambulatory Visit

## 2015-07-09 ENCOUNTER — Ambulatory Visit (HOSPITAL_COMMUNITY)
Admission: RE | Admit: 2015-07-09 | Discharge: 2015-07-09 | Disposition: A | Discharge status: Home / Self Care | Payer: No Typology Code available for payment source | Source: Ambulatory Visit | Attending: Interventional Cardiology | Admitting: Interventional Cardiology

## 2015-07-09 DIAGNOSIS — I77810 Thoracic aortic ectasia: Secondary | ICD-10-CM | POA: Diagnosis not present

## 2015-07-09 MED ORDER — GADOBENATE DIMEGLUMINE 529 MG/ML IV SOLN
20.0000 mL | Freq: Once | INTRAVENOUS | Status: AC
  Administered 2015-07-09: 20 mL via INTRAVENOUS

## 2015-07-11 ENCOUNTER — Other Ambulatory Visit: Payer: Self-pay

## 2015-07-11 DIAGNOSIS — I77810 Thoracic aortic ectasia: Secondary | ICD-10-CM

## 2016-07-09 ENCOUNTER — Ambulatory Visit (HOSPITAL_COMMUNITY)
Admission: RE | Admit: 2016-07-09 | Discharge: 2016-07-09 | Disposition: A | Discharge status: Home / Self Care | Payer: No Typology Code available for payment source | Source: Ambulatory Visit | Attending: Interventional Cardiology | Admitting: Interventional Cardiology

## 2016-07-09 DIAGNOSIS — I712 Thoracic aortic aneurysm, without rupture: Secondary | ICD-10-CM | POA: Insufficient documentation

## 2016-07-09 DIAGNOSIS — I77810 Thoracic aortic ectasia: Secondary | ICD-10-CM | POA: Diagnosis present

## 2016-07-09 MED ORDER — GADOBENATE DIMEGLUMINE 529 MG/ML IV SOLN
20.0000 mL | Freq: Once | INTRAVENOUS | Status: AC
  Administered 2016-07-09: 20 mL via INTRAVENOUS

## 2016-07-15 ENCOUNTER — Telehealth: Payer: Self-pay | Admitting: *Deleted

## 2016-07-15 DIAGNOSIS — I7789 Other specified disorders of arteries and arterioles: Secondary | ICD-10-CM

## 2016-07-15 NOTE — Telephone Encounter (Signed)
-----   Message from Jettie Booze, MD sent at 07/14/2016  5:17 PM EDT ----- Stable enlargement of aorta. Repeat study in one year.

## 2016-08-18 NOTE — Telephone Encounter (Signed)
Please check with [facility] whether he will need repeat PSG or split night study or if we can just proceed with CPAP titration

## 2016-08-20 ENCOUNTER — Telehealth: Payer: Self-pay | Admitting: *Deleted

## 2016-08-20 DIAGNOSIS — G4733 Obstructive sleep apnea (adult) (pediatric): Secondary | ICD-10-CM

## 2016-08-20 NOTE — Telephone Encounter (Signed)
Informed patient of upcoming sleep study and patient understanding was verbalized. Patient understands he sleep study is scheduled for Wednesday October 20 2016. Patient understands his sleep study will be done at [facility] sleep lab. Patient understands he will receive a sleep packet in a week or so. Patient understands to call if he does not receive the sleep packet in a timely manner. Patient agrees with treatment and thanked me for call

## 2016-08-20 NOTE — Telephone Encounter (Signed)
Will defer to Dr. Radford Pax.

## 2016-08-20 NOTE — Telephone Encounter (Signed)
Please order a split night PSG

## 2016-08-20 NOTE — Telephone Encounter (Signed)
-----   Message from Sueanne Margarita, MD sent at 08/18/2016 10:08 AM EDT -----   ----- Message ----- From: Freada Bergeron, CMA Sent: 08/17/2016   6:31 PM To: Sueanne Margarita, MD, Jettie Booze, MD  Patient is ready for his cpap now but he may have to do another sleep study. Last sleep study date is 04/01/2015.

## 2016-08-20 NOTE — Telephone Encounter (Signed)
Per [facility]) patient would have to have another PSG.

## 2016-09-14 NOTE — Telephone Encounter (Signed)
LMTCB

## 2016-10-05 NOTE — Telephone Encounter (Signed)
Patient is still thinking about his sleep study. He wants to see what his insurance will pay before he has it. Patient states he will call his insurance this week.

## 2016-10-20 ENCOUNTER — Encounter (HOSPITAL_BASED_OUTPATIENT_CLINIC_OR_DEPARTMENT_OTHER): Payer: No Typology Code available for payment source

## 2017-04-25 ENCOUNTER — Telehealth: Payer: Self-pay

## 2017-04-25 NOTE — Telephone Encounter (Signed)
   [place], MD  Chart reviewed as part of pre-operative protocol coverage. Because of Breeze Angell Erdman's past medical history and time since last visit, he/she will require a follow-up visit in order to better assess preoperative cardiovascular risk.  Pre-op covering staff: - Please schedule appointment and call patient to inform them. - Please contact requesting surgeon's office via preferred method (i.e, phone, fax) to inform them of need for appointment prior to surgery.  Lyda Jester, PA-C  04/25/2017, 3:55 PM

## 2017-04-25 NOTE — Telephone Encounter (Signed)
   [facility] Pre-operative Risk Assessment    Request for surgical clearance:  1. What type of surgery is being performed? Right partial knee replacement   2. When is this surgery scheduled? pending   3. Are there any medications that need to be held prior to surgery and how long?ASA 81 mg  How many days should patient stop prior to procedure?  4. Practice name and name of physician performing surgery? [facility]  Dr. Elsie Saas   5. What is your office phone and fax number? Ph [phone]  Fax [phone] attn sherri gavin    6. Anesthesia type (None, local, MAC, general) ? Not stated   Tod Persia 04/25/2017, 1:17 PM  _________________________________________________________________   (provider comments below)

## 2017-04-25 NOTE — Telephone Encounter (Signed)
Spoke with patient and let him know that he will need to be seen prior to being cleared for surgery. Scheduled him to see Dr.Varanasi Tuesday 04/26/17 at 8:20am. He verbalized understanding.   Spoke w/ Sherri at Dr. Laureen Abrahams office and let her know patient will need to be seen prior to being cleared for surgery and informed her of date and time of appt. She verbalized understanding.

## 2017-04-26 ENCOUNTER — Encounter (INDEPENDENT_AMBULATORY_CARE_PROVIDER_SITE_OTHER): Payer: Self-pay

## 2017-04-26 ENCOUNTER — Ambulatory Visit: Payer: No Typology Code available for payment source | Admitting: Interventional Cardiology

## 2017-04-26 ENCOUNTER — Encounter: Payer: Self-pay | Admitting: Interventional Cardiology

## 2017-04-26 VITALS — BP 138/90 | HR 74 | Ht 70.5 in | Wt 226.0 lb

## 2017-04-26 DIAGNOSIS — Z8249 Family history of ischemic heart disease and other diseases of the circulatory system: Secondary | ICD-10-CM | POA: Diagnosis not present

## 2017-04-26 DIAGNOSIS — E785 Hyperlipidemia, unspecified: Secondary | ICD-10-CM

## 2017-04-26 DIAGNOSIS — I77810 Thoracic aortic ectasia: Secondary | ICD-10-CM

## 2017-04-26 DIAGNOSIS — Z01810 Encounter for preprocedural cardiovascular examination: Secondary | ICD-10-CM

## 2017-04-26 NOTE — Patient Instructions (Addendum)
Medication Instructions:  Your physician recommends that you continue on your current medications as directed. Please refer to the Current Medication list given to you today.   Labwork: None ordered  Testing/Procedures: None ordered  Follow-Up: Your physician wants you to follow-up in: 1 year with Dr. Irish Lack. You will receive a reminder letter in the mail two months in advance. If you don't receive a letter, please call our office to schedule the follow-up appointment.   Any Other Special Instructions Will Be Listed Below (If Applicable).  No further testing needed prior to your surgery.   If you need a refill on your cardiac medications before your next appointment, please call your pharmacy.

## 2017-04-26 NOTE — Progress Notes (Signed)
Cardiology Office Note   Date:  04/26/2017   ID:  Daniel Humphrey, DOB October 11, 1955, MRN [id number]  PCP:  Shirline Frees, MD    No chief complaint on file.  Preoperative clearance  Wt Readings from Last 3 Encounters:  04/26/17 226 lb (102.5 kg)  01/17/15 223 lb (101.2 kg)  01/08/15 223 lb (101.2 kg)       History of Present Illness: Daniel Humphrey is a 62 y.o. male   who has had a strong family h/o CAD. Three older brothers with stents or CABG or MI.  Negative nuclear study in 10/14.   He had a sleep study showing sleep apnea, but never followe up with CPAP therapy.  He needs knee surgery and is here for clearance.    His most strenuous activity is his job.  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  He has to walk up stairs and does not have problems with that.  He often has to carry things up stairs or move furniture.  He works at [employer].   Denies : Chest pain. Dizziness. Leg edema. Nitroglycerin use. Orthopnea. Palpitations. Paroxysmal nocturnal dyspnea. Shortness of breath. Syncope.   His has gained weight per his report.   Past Medical History:  Diagnosis Date  . Aortic root dilation (HCC)   . ED (erectile dysfunction)   . 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 High cholesterol   . OSA (obstructive sleep apnea) 04/15/2015   Severe OSA with AHI 35.2 and successful CPAP titration to 9cmH2O  . Seasonal allergies     Past Surgical History:  Procedure Laterality Date  . CERVICAL FUSION  1999  . COLONOSCOPY WITH PROPOFOL  04/19/2012   Procedure: COLONOSCOPY WITH PROPOFOL;  Surgeon: Jeryl Columbia, MD;  Location: [facility];  Service: Endoscopy;  Laterality: N/A;  . COLONOSCOPY WITH PROPOFOL N/A 01/17/2015   Procedure: COLONOSCOPY WITH PROPOFOL;  Surgeon: Clarene Essex, MD;  Location: [facility];  Service: Endoscopy;  Laterality: N/A;  . HOT HEMOSTASIS  04/19/2012   Procedure: HOT HEMOSTASIS (ARGON PLASMA COAGULATION/BICAP);  Surgeon: Jeryl Columbia, MD;  Location: [facility];  Service: Endoscopy;  Laterality: N/A;  . HOT HEMOSTASIS N/A 01/17/2015   Procedure: HOT HEMOSTASIS (ARGON PLASMA COAGULATION/BICAP);  Surgeon: Clarene Essex, MD;  Location: [facility];  Service: Endoscopy;  Laterality: N/A;  . NASAL SEPTUM SURGERY  1998     Current Outpatient Medications  Medication Sig Dispense Refill  . aspirin 81 MG tablet Take 81 mg by mouth daily.    . cetirizine-pseudoephedrine (ZYRTEC-D) 5-120 MG per tablet Take 1 tablet by mouth at bedtime.    . fluticasone (FLONASE) 50 MCG/ACT nasal spray Place 1 spray into the nose daily.    . Multiple Vitamin (MULTIVITAMIN WITH MINERALS) TABS Take 1 tablet by mouth daily.    . simvastatin (ZOCOR) 10 MG tablet Take 10 mg by mouth daily before breakfast.     No current facility-administered medications for this visit.     Allergies:   Patient has no known allergies.    Social History:  The patient  reports that he quit smoking about 29 years ago. He has a 3.00 pack-year smoking history. he has never used smokeless tobacco. He reports that he drinks alcohol. He reports that he does not use drugs.   Family History:  The patient's family history includes Heart attack in his brother, brother, father, and mother; Heart disease in his brother and brother;  Hyperlipidemia in his father and mother; Hypertension in his sister.    ROS:  Please see the history of present illness.   Otherwise, review of systems are positive for knee pain.   All other systems are reviewed and negative.    PHYSICAL EXAM: VS:  BP 138/90   Pulse 74   Ht 5' 10.5" (1.791 m)   Wt 226 lb (102.5 kg)   SpO2 94%   BMI 31.97 kg/m  , BMI Body mass index is 31.97 kg/m. GEN: Well nourished, well developed, in no acute distress  HEENT: normal  Neck: no JVD, carotid bruits, or masses Cardiac: RRR; no murmurs, rubs, or gallops,no edema  Respiratory:  clear to auscultation bilaterally, normal work of breathing GI: soft,  nontender, nondistended, + BS MS: no deformity or atrophy  Skin: warm and dry, no rash Neuro:  Strength and sensation are intact Psych: euthymic mood, full affect   EKG:   The ekg ordered today demonstrates NSR, no ST changes   Recent Labs: No results found for requested labs within last 8760 hours.   Lipid Panel No results found for: CHOL, TRIG, HDL, CHOLHDL, VLDL, LDLCALC, LDLDIRECT   Other studies Reviewed: Additional studies/ records that were reviewed today with results demonstrating: 2014 echo showed normal LV function; dilated aortic root.   ASSESSMENT AND PLAN:  1. Preoperative cardiovascular exam:  No further cardiac testing needed before surgery (knee surgery- Dr. Noemi Chapel). 2. Aortic aneurysm, thoracic: Continue with routine f/u with MRA.   May need to change to CT scan if he has knee hardware placed. 3. Family h/o CAD: several brothers with CAD.  He has not had sx as of yet.  4. Hyperlipidemia: COntinue simvastatin.  Would treat RF aggressively in this patient.  5. OSA: This may contribute to his fatigue.  COnsider f/u for CPAP after his knee surgery.   Current medicines are reviewed at length with the patient today.  The patient concerns regarding his medicines were addressed.  The following changes have been made:  No change  Labs/ tests ordered today include:  No orders of the defined types were placed in this encounter.   Recommend 150 minutes/week of aerobic exercise Low fat, low carb, high fiber diet recommended  Disposition:   FU in 1 year   Signed, Larae Grooms, MD  04/26/2017 8:44 AM    [facility] [place], [place], [place]  [address] Phone: [phone]; Fax: [phone]

## 2017-05-06 ENCOUNTER — Encounter (HOSPITAL_COMMUNITY): Payer: Self-pay

## 2017-05-06 ENCOUNTER — Other Ambulatory Visit: Payer: Self-pay

## 2017-05-06 ENCOUNTER — Encounter (HOSPITAL_COMMUNITY)
Admission: RE | Admit: 2017-05-06 | Discharge: 2017-05-06 | Disposition: A | Discharge status: Home / Self Care | Payer: No Typology Code available for payment source | Source: Ambulatory Visit | Attending: Orthopedic Surgery | Admitting: Orthopedic Surgery

## 2017-05-06 HISTORY — DX: Disorder of arteries and arterioles, unspecified: I77.9

## 2017-05-06 HISTORY — DX: Unspecified osteoarthritis, unspecified site: M19.90

## 2017-05-06 LAB — COMPREHENSIVE METABOLIC PANEL
ALK PHOS: 64 U/L (ref 38–126)
ALT: 37 U/L (ref 17–63)
AST: 32 U/L (ref 15–41)
Albumin: 3.4 g/dL — ABNORMAL LOW (ref 3.5–5.0)
Anion gap: 13 (ref 5–15)
BUN: 18 mg/dL (ref 6–20)
CALCIUM: 8.9 mg/dL (ref 8.9–10.3)
CO2: 24 mmol/L (ref 22–32)
CREATININE: 0.97 mg/dL (ref 0.61–1.24)
Chloride: 102 mmol/L (ref 101–111)
GFR calc Af Amer: >60 mL/min (ref >60)
Glucose, Bld: 108 mg/dL — ABNORMAL HIGH (ref 65–99)
Potassium: 4 mmol/L (ref 3.5–5.1)
Sodium: 139 mmol/L (ref 135–145)
Total Bilirubin: 0.3 mg/dL (ref 0.3–1.2)
Total Protein: 7.4 g/dL (ref 6.5–8.1)

## 2017-05-06 LAB — CBC WITH DIFFERENTIAL/PLATELET
Basophils Absolute: 0 10*3/uL (ref 0.0–0.1)
Basophils Relative: 0 %
Eosinophils Absolute: 0.1 10*3/uL (ref 0.0–0.7)
Eosinophils Relative: 1 %
HEMATOCRIT: 37.7 % — AB (ref 39.0–52.0)
HEMOGLOBIN: 12.4 g/dL — AB (ref 13.0–17.0)
LYMPHS ABS: 2.2 10*3/uL (ref 0.7–4.0)
LYMPHS PCT: 20 %
MCH: 31.1 pg (ref 26.0–34.0)
MCHC: 32.9 g/dL (ref 30.0–36.0)
MCV: 94.5 fL (ref 78.0–100.0)
Monocytes Absolute: 1 10*3/uL (ref 0.1–1.0)
Monocytes Relative: 9 %
NEUTROS PCT: 70 %
Neutro Abs: 7.9 10*3/uL — ABNORMAL HIGH (ref 1.7–7.7)
Platelets: 305 10*3/uL (ref 150–400)
RBC: 3.99 MIL/uL — AB (ref 4.22–5.81)
RDW: 13.1 % (ref 11.5–15.5)
WBC: 11.1 10*3/uL — AB (ref 4.0–10.5)

## 2017-05-06 LAB — APTT: aPTT: 35 seconds (ref 24–36)

## 2017-05-06 LAB — PROTIME-INR
INR: 1.09
PROTHROMBIN TIME: 14 s (ref 11.4–15.2)

## 2017-05-06 NOTE — Pre-Procedure Instructions (Signed)
Daniel Humphrey [facility]  05/06/2017      [id number] - [place], [place] - [address] [place] [address] Phone: [phone] Fax: [phone]    Your procedure is scheduled on Monday 05/16/2017  Report to [facility] Admitting at 7:45 A.M.  Call this number if you have problems the morning of surgery:  [phone]               As of 05/12/2017 stop taking Aspirin & Advil   Remember:  Do not eat food or drink liquids after midnight. On SUNDAY night     Take these medicines the morning of surgery with A SIP OF WATER              Tylenol & / or flonase    Do not wear jewelry   Do not wear lotions, powders, or perfumes, or deodorant.    Men may shave face and neck.   Do not bring valuables to the hospital.   [facility] is not responsible for any belongings or valuables.  Contacts, dentures or bridgework may not be worn into surgery.  Leave your suitcase in the car.  After surgery it may be brought to your room.  For patients admitted to the hospital, discharge time will be determined by your treatment team.  Patients discharged the day of surgery will not be allowed to drive home.   Name and phone number of your driver:   With family   Special instructions:  Special Instructions: [place] - Preparing for Surgery  Before surgery, you can play an important role.  Because skin is not sterile, your skin needs to be as free of germs as possible.  You can reduce the number of germs on you skin by washing with CHG (chlorahexidine gluconate) soap before surgery.  CHG is an antiseptic cleaner which kills germs and bonds with the skin to continue killing germs even after washing.  Please DO NOT use if you have an allergy to CHG or antibacterial soaps.  If your skin becomes reddened/irritated stop using the CHG and inform your nurse when you arrive at Short Stay.  Do not shave (including legs and  underarms) for at least 48 hours prior to the first CHG shower.  You may shave your face.  Please follow these instructions carefully:   1.  Shower with CHG Soap the night before surgery and the  morning of Surgery.  2.  If you choose to wash your hair, wash your hair first as usual with your  normal shampoo.  3.  After you shampoo, rinse your hair and body thoroughly to remove the  Shampoo.  4.  Use CHG as you would any other liquid soap.  You can apply chg directly to the skin and wash gently with scrungie or a clean washcloth.  5.  Apply the CHG Soap to your body ONLY FROM THE NECK DOWN.    Do not use on open wounds or open sores.  Avoid contact with your eyes, ears, mouth and genitals (private parts).  Wash genitals (private parts)   with your normal soap.  6.  Wash thoroughly, paying special attention to the area where your surgery will be performed.  7.  Thoroughly rinse your body with warm water from the neck down.  8.  DO NOT shower/wash with your normal soap after using and rinsing off   the CHG  Soap.  9.  Pat yourself dry with a clean towel.            10.  Wear clean pajamas.            11.  Place clean sheets on your bed the night of your first shower and do not sleep with pets.  Day of Surgery  Do not apply any lotions/deodorants the morning of surgery.  Please wear clean clothes to the hospital/surgery center.  Please read over the following fact sheets that you were given. Pain Booklet, Coughing and Deep Breathing and Surgical Site Infection Prevention

## 2017-05-06 NOTE — Progress Notes (Signed)
PCP- Azalia Bilis at [facility], pt. Had recent reeval. For cardiac clearance for surg. With Dr. Irish Lack. Notes in Epic. Pt. Is also followed for aortic root dilation, recent MRI stable. Pt. Reports feeling well, denies chest &/or breathing concerns. Pt. Will further explore use of CPAP, study proves that it could benefit him.

## 2017-05-08 LAB — URINE CULTURE: Culture: <10000 — AB

## 2017-05-09 ENCOUNTER — Encounter (HOSPITAL_BASED_OUTPATIENT_CLINIC_OR_DEPARTMENT_OTHER): Payer: Self-pay | Admitting: Emergency Medicine

## 2017-05-09 ENCOUNTER — Other Ambulatory Visit: Payer: Self-pay

## 2017-05-09 ENCOUNTER — Emergency Department (HOSPITAL_BASED_OUTPATIENT_CLINIC_OR_DEPARTMENT_OTHER): Payer: No Typology Code available for payment source

## 2017-05-09 ENCOUNTER — Inpatient Hospital Stay (HOSPITAL_BASED_OUTPATIENT_CLINIC_OR_DEPARTMENT_OTHER)
Admission: EM | Admit: 2017-05-09 | Discharge: 2017-05-13 | DRG: 373 | Disposition: A | Discharge status: Home / Self Care | Payer: No Typology Code available for payment source | Attending: Surgery | Admitting: Surgery

## 2017-05-09 DIAGNOSIS — M1711 Unilateral primary osteoarthritis, right knee: Secondary | ICD-10-CM | POA: Diagnosis present

## 2017-05-09 DIAGNOSIS — K3532 Acute appendicitis with perforation and localized peritonitis, without abscess: Secondary | ICD-10-CM | POA: Diagnosis present

## 2017-05-09 DIAGNOSIS — Z7951 Long term (current) use of inhaled steroids: Secondary | ICD-10-CM

## 2017-05-09 DIAGNOSIS — Z8249 Family history of ischemic heart disease and other diseases of the circulatory system: Secondary | ICD-10-CM

## 2017-05-09 DIAGNOSIS — Z7982 Long term (current) use of aspirin: Secondary | ICD-10-CM

## 2017-05-09 DIAGNOSIS — K3533 Acute appendicitis with perforation and localized peritonitis, with abscess: Secondary | ICD-10-CM | POA: Diagnosis present

## 2017-05-09 DIAGNOSIS — Z8349 Family history of other endocrine, nutritional and metabolic diseases: Secondary | ICD-10-CM

## 2017-05-09 DIAGNOSIS — E785 Hyperlipidemia, unspecified: Secondary | ICD-10-CM | POA: Diagnosis present

## 2017-05-09 DIAGNOSIS — I77810 Thoracic aortic ectasia: Secondary | ICD-10-CM | POA: Diagnosis present

## 2017-05-09 DIAGNOSIS — Z86010 Personal history of colonic polyps: Secondary | ICD-10-CM | POA: Diagnosis not present

## 2017-05-09 DIAGNOSIS — Z981 Arthrodesis status: Secondary | ICD-10-CM | POA: Diagnosis not present

## 2017-05-09 DIAGNOSIS — Z87891 Personal history of nicotine dependence: Secondary | ICD-10-CM | POA: Diagnosis not present

## 2017-05-09 DIAGNOSIS — G4733 Obstructive sleep apnea (adult) (pediatric): Secondary | ICD-10-CM | POA: Diagnosis present

## 2017-05-09 DIAGNOSIS — Z8619 Personal history of other infectious and parasitic diseases: Secondary | ICD-10-CM

## 2017-05-09 LAB — URINALYSIS, ROUTINE W REFLEX MICROSCOPIC
BILIRUBIN URINE: NEGATIVE
GLUCOSE, UA: NEGATIVE mg/dL
Hgb urine dipstick: NEGATIVE
KETONES UR: NEGATIVE mg/dL
Leukocytes, UA: NEGATIVE
NITRITE: NEGATIVE
PH: 5.5 (ref 5.0–8.0)
Protein, ur: NEGATIVE mg/dL
Specific Gravity, Urine: 1.025 (ref 1.005–1.030)

## 2017-05-09 LAB — COMPREHENSIVE METABOLIC PANEL
ALBUMIN: 3.5 g/dL (ref 3.5–5.0)
ALT: 52 U/L (ref 17–63)
ANION GAP: 10 (ref 5–15)
AST: 38 U/L (ref 15–41)
Alkaline Phosphatase: 62 U/L (ref 38–126)
BILIRUBIN TOTAL: 0.2 mg/dL — AB (ref 0.3–1.2)
BUN: 23 mg/dL — ABNORMAL HIGH (ref 6–20)
CO2: 26 mmol/L (ref 22–32)
Calcium: 9 mg/dL (ref 8.9–10.3)
Chloride: 103 mmol/L (ref 101–111)
Creatinine, Ser: 1.13 mg/dL (ref 0.61–1.24)
Glucose, Bld: 112 mg/dL — ABNORMAL HIGH (ref 65–99)
POTASSIUM: 4 mmol/L (ref 3.5–5.1)
Sodium: 139 mmol/L (ref 135–145)
TOTAL PROTEIN: 7.5 g/dL (ref 6.5–8.1)

## 2017-05-09 LAB — LIPASE, BLOOD: LIPASE: 30 U/L (ref 11–51)

## 2017-05-09 MED ORDER — PIPERACILLIN-TAZOBACTAM 3.375 G IVPB 30 MIN
3.3750 g | Freq: Once | INTRAVENOUS | Status: AC
  Administered 2017-05-09: 3.375 g via INTRAVENOUS
  Filled 2017-05-09 (×2): qty 50

## 2017-05-09 MED ORDER — SODIUM CHLORIDE 0.9 % IV SOLN
INTRAVENOUS | Status: AC
  Administered 2017-05-10: 1000 mL via INTRAVENOUS

## 2017-05-09 MED ORDER — IOPAMIDOL (ISOVUE-300) INJECTION 61%
100.0000 mL | Freq: Once | INTRAVENOUS | Status: AC | PRN
  Administered 2017-05-09: 100 mL via INTRAVENOUS

## 2017-05-09 NOTE — ED Provider Notes (Signed)
[facility] EMERGENCY DEPARTMENT Provider Note   CSN: [id number] Arrival date & time: 05/09/17  1806     History   Chief Complaint Chief Complaint  Patient presents with  . Abdominal Pain    HPI Daniel Humphrey is a 62 y.o. male.  HPI  62 year old male presents with lower abdominal pain.  He was sent from his PCP after our clinic to rule out appendicitis.  He states that around 9 days ago he was helping a friend move and the next day he noticed some lower abdominal pain.  He thought he pulled a muscle.  However the pain is persisted and seems to be more right-sided.  Sometimes certain movements make it worse but sometimes it will worsen by itself.  When it is flaring it is about a 7 out of 10.  No back pain.  He has had a low-grade fever of 99.8 and some nausea over the last several days.  No diarrhea or constipation.  Patient presents with lab work which shows a hemoglobin of around 12.6 and a white blood cell count of 8.  Past Medical History:  Diagnosis Date  . Aorta disorder ([place])    dilated, followed by MRI's   . Aortic root dilation (HCC)   . Arthritis    OA- R knee, hands   . ED (erectile dysfunction)   . 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 High cholesterol   . OSA (obstructive sleep apnea) 04/15/2015   Severe OSA with AHI 35.2 and successful CPAP titration to 9cmH2O, needs CPAP, will continue to explore   . Seasonal allergies     Patient Active Problem List   Diagnosis Date Noted  . Acute appendicitis with perforation and peritoneal abscess 05/09/2017  . OSA (obstructive sleep apnea) 04/15/2015  . Observed sleep apnea 01/08/2015  . Hyperlipidemia 01/08/2015  . Family history of heart disease 12/27/2013  . Dilated aortic root ([place]) 12/27/2013    Past Surgical History:  Procedure Laterality Date  . CERVICAL FUSION  1999  . COLONOSCOPY WITH PROPOFOL  04/19/2012   Procedure: COLONOSCOPY WITH PROPOFOL;  Surgeon: Jeryl Columbia, MD;  Location: [facility];   Service: Endoscopy;  Laterality: N/A;  . COLONOSCOPY WITH PROPOFOL N/A 01/17/2015   Procedure: COLONOSCOPY WITH PROPOFOL;  Surgeon: Clarene Essex, MD;  Location: [facility];  Service: Endoscopy;  Laterality: N/A;  . HOT HEMOSTASIS  04/19/2012   Procedure: HOT HEMOSTASIS (ARGON PLASMA COAGULATION/BICAP);  Surgeon: Jeryl Columbia, MD;  Location: [facility];  Service: Endoscopy;  Laterality: N/A;  . HOT HEMOSTASIS N/A 01/17/2015   Procedure: HOT HEMOSTASIS (ARGON PLASMA COAGULATION/BICAP);  Surgeon: Clarene Essex, MD;  Location: [facility];  Service: Endoscopy;  Laterality: N/A;  . NASAL SEPTUM SURGERY  1998       Home Medications    Prior to Admission medications   Medication Sig Start Date End Date Taking? Authorizing Provider  aspirin 81 MG tablet Take 81 mg by mouth daily.    [provider]  cetirizine-pseudoephedrine (ZYRTEC-D) 5-120 MG per tablet Take 1 tablet by mouth at bedtime.    [provider]  fluticasone (FLONASE) 50 MCG/ACT nasal spray Place 1 spray into the nose daily.    [provider]  ibuprofen (ADVIL,MOTRIN) 200 MG tablet Take 200-400 mg by mouth daily as needed for headache.    [provider]  ibuprofen (ADVIL,MOTRIN) 200 MG tablet Take 200 mg by mouth every 6 (six) hours as needed.    [provider]  Multiple Vitamin (MULTIVITAMIN WITH MINERALS) TABS Take 1 tablet by mouth daily.    [provider]  simvastatin (ZOCOR) 10 MG tablet Take 10 mg by mouth daily.     [provider]    Family History Family History  Problem Relation Age of Onset  . Heart attack Mother   . Hyperlipidemia Mother   . Heart attack Father   . Hyperlipidemia Father   . Hypertension Sister   . Heart attack Brother   . Heart disease Brother   . Heart attack Brother   . Heart disease Brother     Social History Social History   Tobacco Use  . Smoking status: Former Smoker    Packs/day: 1.00    Years: 3.00    Pack years:  3.00    Last attempt to quit: 07/14/1987    Years since quitting: 29.8  . Smokeless tobacco: Never Used  Substance Use Topics  . Alcohol use: Yes    Comment: rare  . Drug use: No     Allergies   Patient has no known allergies.   Review of Systems Review of Systems  Constitutional: Positive for fever (low grade).  Gastrointestinal: Positive for abdominal pain and nausea. Negative for constipation, diarrhea and vomiting.  Musculoskeletal: Negative for back pain.  All other systems reviewed and are negative.    Physical Exam Updated Vital Signs BP 134/75 (BP Location: Right Arm)   Pulse 90   Temp 98.6 F (37 C) (Oral)   Resp 18   Ht 5\' 11"  (1.803 m)   Wt 99.3 kg (219 lb)   SpO2 96%   BMI 30.54 kg/m   Physical Exam  Constitutional: He is oriented to person, place, and time. He appears well-developed and well-nourished.  Non-toxic appearance. He does not appear ill. No distress.  HENT:  Head: Normocephalic and atraumatic.  Right Ear: External ear normal.  Left Ear: External ear normal.  Nose: Nose normal.  Eyes: Right eye exhibits no discharge. Left eye exhibits no discharge.  Neck: Neck supple.  Cardiovascular: Normal rate, regular rhythm and normal heart sounds.  Pulmonary/Chest: Effort normal and breath sounds normal.  Abdominal: Soft. There is tenderness in the right lower quadrant and suprapubic area.  Musculoskeletal: He exhibits no edema.  Neurological: He is alert and oriented to person, place, and time.  Skin: Skin is warm and dry.  Nursing note and vitals reviewed.    ED Treatments / Results  Labs (all labs ordered are listed, but only abnormal results are displayed) Labs Reviewed  COMPREHENSIVE METABOLIC PANEL - Abnormal; Notable for the following components:      Result Value   Glucose, Bld 112 (*)    BUN 23 (*)    Total Bilirubin 0.2 (*)    All other components within normal limits  URINALYSIS, ROUTINE W REFLEX MICROSCOPIC  LIPASE, BLOOD     EKG  EKG Interpretation None       Radiology Ct Abdomen Pelvis W Contrast  Result Date: 05/09/2017 CLINICAL DATA:  62 year old male with right lower quadrant abdominal pain. EXAM: CT ABDOMEN AND PELVIS WITH CONTRAST TECHNIQUE: Multidetector CT imaging of the abdomen and pelvis was performed using the standard protocol following bolus administration of intravenous contrast. CONTRAST:  163mL ISOVUE-300 IOPAMIDOL (ISOVUE-300) INJECTION 61% COMPARISON:  None. FINDINGS: Lower chest: The visualized lung bases are clear. No intra-abdominal free air or free fluid. Hepatobiliary: No focal liver abnormality is seen. No gallstones, gallbladder wall thickening, or biliary dilatation. Pancreas:  Unremarkable. No pancreatic ductal dilatation or surrounding inflammatory changes. Spleen: Normal in size without focal abnormality. Adrenals/Urinary Tract: Adrenal glands are unremarkable. Kidneys are normal, without renal calculi, focal lesion, or hydronephrosis. Bladder is unremarkable. Stomach/Bowel: There is no bowel obstruction. There is inflammatory changes and thickening of the appendix extending to the base of the cecum. There is a 5.7 x 4.8 x 6.5 cm fluid collection in the tip of the appendix which may represent an abscess or a loculated perforation. The appendix is located the right lower quadrant inferior to the cecum and extending into the right hemipelvis. The appendix measures approximately 2 cm in thickness. Vascular/Lymphatic: Mild aortoiliac atherosclerotic disease. The IVC appears unremarkable. No portal venous gas. There is no adenopathy. Reproductive: The prostate and seminal vesicles are grossly unremarkable. Other: None Musculoskeletal: Mild degenerative changes of the spine. No acute osseous pathology. Probable L3 hemangioma. IMPRESSION: Acute appendicitis with appendiceal abscess or loculated perforation. These results were called by telephone at the time of interpretation on 05/09/2017 at 10:48 pm  to Dr. Sherwood Gambler , who verbally acknowledged these results. Electronically Signed   By: Anner Crete M.D.   On: 05/09/2017 22:49    Procedures Procedures (including critical care time)  Medications Ordered in ED Medications  0.9 %  sodium chloride infusion (not administered)  iopamidol (ISOVUE-300) 61 % injection 100 mL (100 mLs Intravenous Contrast Given 05/09/17 2223)  piperacillin-tazobactam (ZOSYN) IVPB 3.375 g (3.375 g Intravenous New Bag/Given 05/09/17 2300)     Initial Impression / Assessment and Plan / ED Course  I have reviewed the triage vital signs and the nursing notes.  Pertinent labs & imaging results that were available during my care of the patient were reviewed by me and considered in my medical decision making (see chart for details).     CT scan shows ruptured appendicitis with abscess.  He was started on IV Zosyn.  Kept n.p.o.  Initially CBC not obtained given white blood cell count and hemoglobin reviewed from earlier in the day.  This can be trended in the hospital.  I discussed with general surgery, Dr. Excell Seltzer, who accepts patient in admission and transfer to [facility]  Admit to a medical surgical bed for inpatient admission.  Patient remained stable and will be transferred when a bed is available.  Final Clinical Impressions(s) / ED Diagnoses   Final diagnoses:  Acute appendicitis with perforation and peritoneal abscess    ED Discharge Orders    None       Sherwood Gambler, MD 05/10/17 [phone]

## 2017-05-09 NOTE — ED Notes (Signed)
ED Provider at bedside. 

## 2017-05-09 NOTE — ED Triage Notes (Signed)
Sent from [place]  R/o appendicitis

## 2017-05-09 NOTE — ED Notes (Signed)
Patient transported to CT 

## 2017-05-10 ENCOUNTER — Inpatient Hospital Stay (HOSPITAL_COMMUNITY): Payer: No Typology Code available for payment source

## 2017-05-10 DIAGNOSIS — K3532 Acute appendicitis with perforation and localized peritonitis, without abscess: Secondary | ICD-10-CM | POA: Diagnosis present

## 2017-05-10 LAB — BASIC METABOLIC PANEL
Anion gap: 7 (ref 5–15)
BUN: 18 mg/dL (ref 6–20)
CO2: 26 mmol/L (ref 22–32)
CREATININE: 1.04 mg/dL (ref 0.61–1.24)
Calcium: 8.7 mg/dL — ABNORMAL LOW (ref 8.9–10.3)
Chloride: 102 mmol/L (ref 101–111)
Glucose, Bld: 101 mg/dL — ABNORMAL HIGH (ref 65–99)
Potassium: 4.3 mmol/L (ref 3.5–5.1)
Sodium: 135 mmol/L (ref 135–145)

## 2017-05-10 LAB — PROTIME-INR
INR: 1.12
PROTHROMBIN TIME: 14.3 s (ref 11.4–15.2)

## 2017-05-10 LAB — CBC
HCT: 35 % — ABNORMAL LOW (ref 39.0–52.0)
HEMOGLOBIN: 11.6 g/dL — AB (ref 13.0–17.0)
MCH: 30.9 pg (ref 26.0–34.0)
MCHC: 33.1 g/dL (ref 30.0–36.0)
MCV: 93.3 fL (ref 78.0–100.0)
PLATELETS: 326 10*3/uL (ref 150–400)
RBC: 3.75 MIL/uL — ABNORMAL LOW (ref 4.22–5.81)
RDW: 12.9 % (ref 11.5–15.5)
WBC: 8.5 10*3/uL (ref 4.0–10.5)

## 2017-05-10 MED ORDER — OXYCODONE-ACETAMINOPHEN 5-325 MG PO TABS
1.0000 | ORAL_TABLET | ORAL | Status: DC | PRN
  Administered 2017-05-11: 1 via ORAL
  Filled 2017-05-10: qty 1

## 2017-05-10 MED ORDER — MIDAZOLAM HCL 2 MG/2ML IJ SOLN
INTRAMUSCULAR | Status: AC
  Filled 2017-05-10: qty 4

## 2017-05-10 MED ORDER — IBUPROFEN 200 MG PO TABS: 600.0000 mg | ORAL_TABLET | Freq: Four times a day (QID) | ORAL | Status: DC | PRN

## 2017-05-10 MED ORDER — FENTANYL CITRATE (PF) 100 MCG/2ML IJ SOLN
INTRAMUSCULAR | Status: AC
  Filled 2017-05-10: qty 2

## 2017-05-10 MED ORDER — ACETAMINOPHEN 650 MG RE SUPP: 650.0000 mg | Freq: Four times a day (QID) | RECTAL | Status: DC | PRN

## 2017-05-10 MED ORDER — LIDOCAINE-EPINEPHRINE 2 %-1:100000 IJ SOLN
INTRAMUSCULAR | Status: AC | PRN
  Administered 2017-05-10: 10 mL via INTRADERMAL

## 2017-05-10 MED ORDER — ONDANSETRON HCL 4 MG/2ML IJ SOLN
4.0000 mg | Freq: Once | INTRAMUSCULAR | Status: AC
  Administered 2017-05-10: 4 mg via INTRAVENOUS
  Filled 2017-05-10: qty 2

## 2017-05-10 MED ORDER — PIPERACILLIN-TAZOBACTAM 3.375 G IVPB 30 MIN
3.3750 g | Freq: Once | INTRAVENOUS | Status: AC
  Administered 2017-05-10: 3.375 g via INTRAVENOUS
  Filled 2017-05-10 (×2): qty 50

## 2017-05-10 MED ORDER — DIPHENHYDRAMINE HCL 50 MG/ML IJ SOLN: 25.0000 mg | Freq: Four times a day (QID) | INTRAMUSCULAR | Status: DC | PRN

## 2017-05-10 MED ORDER — ONDANSETRON HCL 4 MG/2ML IJ SOLN: 4.0000 mg | Freq: Four times a day (QID) | INTRAMUSCULAR | Status: DC | PRN

## 2017-05-10 MED ORDER — MORPHINE SULFATE (PF) 2 MG/ML IV SOLN
2.0000 mg | INTRAVENOUS | Status: DC | PRN
  Administered 2017-05-10: 2 mg via INTRAVENOUS
  Filled 2017-05-10: qty 1

## 2017-05-10 MED ORDER — ONDANSETRON 4 MG PO TBDP: 4.0000 mg | ORAL_TABLET | Freq: Four times a day (QID) | ORAL | Status: DC | PRN

## 2017-05-10 MED ORDER — KCL IN DEXTROSE-NACL 20-5-0.9 MEQ/L-%-% IV SOLN
INTRAVENOUS | Status: DC
  Administered 2017-05-10 – 2017-05-13 (×5): via INTRAVENOUS
  Filled 2017-05-10 (×7): qty 1000

## 2017-05-10 MED ORDER — FENTANYL CITRATE (PF) 100 MCG/2ML IJ SOLN
INTRAMUSCULAR | Status: AC | PRN
  Administered 2017-05-10 (×2): 50 ug via INTRAVENOUS

## 2017-05-10 MED ORDER — DIPHENHYDRAMINE HCL 25 MG PO CAPS: 25.0000 mg | ORAL_CAPSULE | Freq: Four times a day (QID) | ORAL | Status: DC | PRN

## 2017-05-10 MED ORDER — MIDAZOLAM HCL 2 MG/2ML IJ SOLN
INTRAMUSCULAR | Status: AC | PRN
  Administered 2017-05-10: 2 mg via INTRAVENOUS

## 2017-05-10 MED ORDER — ACETAMINOPHEN 325 MG PO TABS: 650.0000 mg | ORAL_TABLET | Freq: Four times a day (QID) | ORAL | Status: DC | PRN

## 2017-05-10 MED ORDER — PIPERACILLIN-TAZOBACTAM 3.375 G IVPB
3.3750 g | Freq: Three times a day (TID) | INTRAVENOUS | Status: DC
  Administered 2017-05-10 – 2017-05-13 (×9): 3.375 g via INTRAVENOUS
  Filled 2017-05-10 (×7): qty 50

## 2017-05-10 MED ORDER — LACTATED RINGERS IV SOLN
INTRAVENOUS | Status: DC
  Administered 2017-05-10: 09:00:00 via INTRAVENOUS

## 2017-05-10 NOTE — Procedures (Signed)
Pre procedural Dx: Periappendiceal Abscess Post procedural Dx: Same  Technically successful CT guided placed of a 12 Fr drainage catheter placement into the peri appendiceal abscess yielding 80 cc of thick slightly bloody though non-foul smelling fluid.    All aspirated samples sent to the laboratory for analysis.    EBL: Minimal  Complications: None immediate  Ronny Bacon, MD Pager #: [phone]

## 2017-05-10 NOTE — H&P (Addendum)
Daniel Humphrey is an 62 y.o. male.   PCP:  Shirline Frees, MD Chief Complaint: abdominal pain   HPI: 62 y/o presented to ED at [facility] last PM after referral from the PCP for appendicitis. 9 days prior he had been helping a friend move some furniture and thought he had pulled a muscle.  Pain became worse, he developed a low grade fever, and was seen by his PCP.  Pain was primarily mid and now more RLQ pain.  He is not nearly as tender as you would think.   Work up last PM shows he is afebrile, BP up some.  WBC done by PCP 05/06/17 was 11.1.  CMP is normal yesterday, no CBC yesterday.  CT scan completed last PM shows:There is no bowel obstruction. There is inflammatory changes and thickening of the appendix extending to the base of the cecum. There is a 5.7 x 4.8 x 6.5 cm fluid collection in the tip of the appendix which may represent an abscess or a loculated perforation. The appendix is located the right lower quadrant inferior to the cecum and extending into the right hemipelvis. The appendix measures approximately 2 cm in thickness.  Consistent with Acute appendicitis with appendiceal abscess/loculated perforation.    Dr. Excell Seltzer was contacted last PM for evaluation and treatment.  He is awaiting bed placement.  I have ask he be transferred to [facility] ED so we can get treatment started.  Dr. Lucia Gaskins has reviewed the CT, it is his opinion this is amenable to drainage by IR.  We will ask IR to see for possible drain placement when he gets to the ED here. He was seen on 04/26/17 by Dr. Lendell Caprice, for cardiology clearance for elective knee surgery, and no further testing or treatment needed for surgery at that time.  Past Medical History:  Diagnosis Date  . Aorta disorder ([place])    dilated, followed by MRI's   . Aortic root dilation ([facility])   . Arthritis    OA- R knee, hands   . ED (erectile dysfunction)   . 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 High cholesterol   . OSA (obstructive sleep apnea) 04/15/2015    Severe OSA with AHI 35.2 and successful CPAP titration to 9cmH2O, needs CPAP, will continue to explore   . Seasonal allergies     Past Surgical History:  Procedure Laterality Date  . CERVICAL FUSION  1999  . COLONOSCOPY WITH PROPOFOL  04/19/2012   Procedure: COLONOSCOPY WITH PROPOFOL;  Surgeon: Jeryl Columbia, MD;  Location: [facility] ENDOSCOPY;  Service: Endoscopy;  Laterality: N/A;  . COLONOSCOPY WITH PROPOFOL N/A 01/17/2015   Procedure: COLONOSCOPY WITH PROPOFOL;  Surgeon: Clarene Essex, MD;  Location: [facility] ENDOSCOPY;  Service: Endoscopy;  Laterality: N/A;  . HOT HEMOSTASIS  04/19/2012   Procedure: HOT HEMOSTASIS (ARGON PLASMA COAGULATION/BICAP);  Surgeon: Jeryl Columbia, MD;  Location: [facility];  Service: Endoscopy;  Laterality: N/A;  . HOT HEMOSTASIS N/A 01/17/2015   Procedure: HOT HEMOSTASIS (ARGON PLASMA COAGULATION/BICAP);  Surgeon: Clarene Essex, MD;  Location: [facility] ENDOSCOPY;  Service: Endoscopy;  Laterality: N/A;  . NASAL SEPTUM SURGERY  1998    Family History  Problem Relation Age of Onset  . Heart attack Mother   . Hyperlipidemia Mother   . Heart attack Father   . Hyperlipidemia Father   . Hypertension Sister   . Heart attack Brother   . Heart disease Brother   . Heart attack Brother   . Heart disease Brother  Social History:  reports that he quit smoking about 29 years ago. He has a 3.00 pack-year smoking history. he has never used smokeless tobacco. He reports that he drinks alcohol. He reports that he does not use drugs.  Allergies: No Known Allergies   Prior to Admission medications   Medication Sig Start Date End Date Taking? Authorizing Provider  aspirin 81 MG tablet Take 81 mg by mouth daily.    [provider]  cetirizine-pseudoephedrine (ZYRTEC-D) 5-120 MG per tablet Take 1 tablet by mouth at bedtime.    [provider]  fluticasone (FLONASE) 50 MCG/ACT nasal spray Place 1 spray into the nose daily.    [provider]  ibuprofen (ADVIL,MOTRIN) 200  MG tablet Take 200-400 mg by mouth daily as needed for headache.    [provider]  ibuprofen (ADVIL,MOTRIN) 200 MG tablet Take 200 mg by mouth every 6 (six) hours as needed.    [provider]  Multiple Vitamin (MULTIVITAMIN WITH MINERALS) TABS Take 1 tablet by mouth daily.    [provider]  simvastatin (ZOCOR) 10 MG tablet Take 10 mg by mouth daily.     [provider]     Results for orders placed or performed during the hospital encounter of 05/09/17 (from the past 48 hour(s))  Urinalysis, Routine w reflex microscopic     Status: None   Collection Time: 05/09/17  6:27 PM  Result Value Ref Range   Color, Urine YELLOW YELLOW   APPearance CLEAR CLEAR   Specific Gravity, Urine 1.025 1.005 - 1.030   pH 5.5 5.0 - 8.0   Glucose, UA NEGATIVE NEGATIVE mg/dL   Hgb urine dipstick NEGATIVE NEGATIVE   Bilirubin Urine NEGATIVE NEGATIVE   Ketones, ur NEGATIVE NEGATIVE mg/dL   Protein, ur NEGATIVE NEGATIVE mg/dL   Nitrite NEGATIVE NEGATIVE   Leukocytes, UA NEGATIVE NEGATIVE    Comment: Microscopic not done on urines with negative protein, blood, leukocytes, nitrite, or glucose < 500 mg/dL. Performed at [facility], [address] [place], [place] [address]   Comprehensive metabolic panel     Status: Abnormal   Collection Time: 05/09/17  6:27 PM  Result Value Ref Range   Sodium 139 135 - 145 mmol/L   Potassium 4.0 3.5 - 5.1 mmol/L   Chloride 103 101 - 111 mmol/L   CO2 26 22 - 32 mmol/L   Glucose, Bld 112 (H) 65 - 99 mg/dL   BUN 23 (H) 6 - 20 mg/dL   Creatinine, Ser 1.13 0.61 - 1.24 mg/dL   Calcium 9.0 8.9 - 10.3 mg/dL   Total Protein 7.5 6.5 - 8.1 g/dL   Albumin 3.5 3.5 - 5.0 g/dL   AST 38 15 - 41 U/L   ALT 52 17 - 63 U/L   Alkaline Phosphatase 62 38 - 126 U/L   Total Bilirubin 0.2 (L) 0.3 - 1.2 mg/dL   GFR calc non Af Amer >60 >60 mL/min   GFR calc Af Amer >60 >60 mL/min    Comment: (NOTE) The eGFR has been calculated using the CKD  EPI equation. This calculation has not been validated in all clinical situations. eGFR's persistently <60 mL/min signify possible Chronic Kidney Disease.    Anion gap 10 5 - 15    Comment: Performed at [facility], [address] [place], [place] [address]  Lipase, blood     Status: None   Collection Time: 05/09/17  6:27 PM  Result Value Ref  Range   Lipase 30 11 - 51 U/L    Comment: Performed at [facility], [address] [place], [place] [address]   Ct Abdomen Pelvis W Contrast  Result Date: 05/09/2017 CLINICAL DATA:  62 year old male with right lower quadrant abdominal pain. EXAM: CT ABDOMEN AND PELVIS WITH CONTRAST TECHNIQUE: Multidetector CT imaging of the abdomen and pelvis was performed using the standard protocol following bolus administration of intravenous contrast. CONTRAST:  146m ISOVUE-300 IOPAMIDOL (ISOVUE-300) INJECTION 61% COMPARISON:  None. FINDINGS: Lower chest: The visualized lung bases are clear. No intra-abdominal free air or free fluid. Hepatobiliary: No focal liver abnormality is seen. No gallstones, gallbladder wall thickening, or biliary dilatation. Pancreas: Unremarkable. No pancreatic ductal dilatation or surrounding inflammatory changes. Spleen: Normal in size without focal abnormality. Adrenals/Urinary Tract: Adrenal glands are unremarkable. Kidneys are normal, without renal calculi, focal lesion, or hydronephrosis. Bladder is unremarkable. Stomach/Bowel: There is no bowel obstruction. There is inflammatory changes and thickening of the appendix extending to the base of the cecum. There is a 5.7 x 4.8 x 6.5 cm fluid collection in the tip of the appendix which may represent an abscess or a loculated perforation. The appendix is located the right lower quadrant inferior to the cecum and extending into the right hemipelvis. The appendix measures approximately 2 cm in thickness. Vascular/Lymphatic: Mild aortoiliac atherosclerotic disease. The IVC  appears unremarkable. No portal venous gas. There is no adenopathy. Reproductive: The prostate and seminal vesicles are grossly unremarkable. Other: None Musculoskeletal: Mild degenerative changes of the spine. No acute osseous pathology. Probable L3 hemangioma. IMPRESSION: Acute appendicitis with appendiceal abscess or loculated perforation. These results were called by telephone at the time of interpretation on 05/09/2017 at 10:48 pm to Dr. SSherwood Gambler, who verbally acknowledged these results. Electronically Signed   By: AAnner CreteM.D.   On: 05/09/2017 22:49    Review of Systems  Constitutional: Positive for fever (low grade at home) and weight loss (12 lbs). Negative for chills, diaphoresis and malaise/fatigue.       He was tolerating some PO with some occasional nausea.  HENT: Negative.   Eyes: Negative.   Respiratory: Negative.   Cardiovascular: Negative.   Gastrointestinal: Positive for abdominal pain (mid abdomen to start but the RLQ) and nausea. Negative for blood in stool, constipation, diarrhea, heartburn, melena and vomiting.  Genitourinary: Negative.   Musculoskeletal: Positive for joint pain (need right knee surgery).  Skin: Negative.   Neurological: Negative for weakness.  Endo/Heme/Allergies: Negative.   Psychiatric/Behavioral: Negative.     Blood pressure 120/70, pulse 85, temperature 98.4 F (36.9 C), temperature source Oral, resp. rate 18, height _0  (1.803 m), weight 99.3 kg (219 lb), SpO2 98 %. Physical Exam  Constitutional: He is oriented to person, place, and time. He appears well-developed and well-nourished. No distress.  HENT:  Head: Normocephalic and atraumatic.  Nose: Nose normal.  Mouth/Throat: No oropharyngeal exudate.  Eyes: Right eye exhibits no discharge. Left eye exhibits no discharge. No scleral icterus.  Pupils are equal  Neck: Normal range of motion. Neck supple. No JVD present. No tracheal deviation present. No thyromegaly present.   Cardiovascular: Normal rate, regular rhythm, normal heart sounds and intact distal pulses.  No murmur heard. Respiratory: No respiratory distress. He has no wheezes. He has no rales. He exhibits no tenderness.  GI: Soft. Bowel sounds are normal. He exhibits no distension and no mass. There is tenderness (RLQ). There is no rebound and no guarding.  Musculoskeletal:  He exhibits no edema or tenderness.  Lymphadenopathy:    He has no cervical adenopathy.  Neurological: He is alert and oriented to person, place, and time. No cranial nerve deficit.  Skin: Skin is warm and dry. No rash noted. He is not diaphoretic. No erythema. No pallor.  Psychiatric: He has a normal mood and affect. His behavior is normal. Judgment and thought content normal.     Assessment/Plan Perforated appendix OSA - CPAP is pending Dilated aorta - stable Hyperlipidemia  Osteoarthritis with pain R knee Hx of hepatitis A  Plan:  Admit, IV Zosyn, IR drain, I will give him clears, he has been eating at home without much issue.  Bowel rest, IV hydration.  JENNINGS,WILLARD, PA-C 05/10/2017, 7:29 AM  Agree with above.  He spent a long time at [facility], because of limited beds at [facility]. Plan perc drain later today.  His story is typical for appendicitis, though he has surprisingly minimal symptoms.  He is due to have arthroscopic right knee surgery by Dr. Ardeen Jourdain next week - this will be delayed. His wife, Abigail Butts, is having an esophageal test/study today at [facility] - his other daughter is with him. His daughter, Nira Conn, is with him. He also has a son, who lives with him.  He works at Museum/gallery curator at [employer].  Alphonsa Overall, MD, [facility] Surgery Pager: [phone] Office phone:  [phone]   Alphonsa Overall, MD, [facility] Surgery Pager: [phone] Office phone:  [phone]

## 2017-05-10 NOTE — Progress Notes (Signed)
Pt gives permission to ask questions on nursing admission hx in front of his wife. Lucius Conn BSN, RN-BC Admissions RN 05/10/2017 7:00 PM

## 2017-05-10 NOTE — ED Provider Notes (Signed)
  Physical Exam  BP 120/70 (BP Location: Right Arm)   Pulse 85   Temp 98.4 F (36.9 C) (Oral)   Resp 18   Ht 5\' 11"  (1.803 m)   Wt 99.3 kg (219 lb)   SpO2 98%   BMI 30.54 kg/m   Physical Exam  ED Course/Procedures     Procedures  MDM  Patient admitted to [facility] for appendicitis with abscess under surgery. No beds yet. Given zosyn already last night. Surgery called and request ED to ED transfer to get IR to place a drain. Will give second dose of zosyn. Dr. Marcina Millard at [facility] ED aware and will page general surgery when patient arrives to the ED     Drenda Freeze, MD 05/10/17 [phone]

## 2017-05-10 NOTE — ED Notes (Signed)
Bed: WA07 Expected date:  Expected time:  Means of arrival:  Comments: Transfer from [facility]

## 2017-05-10 NOTE — ED Notes (Signed)
AM labs drawn and sent.  VSS.   [facility] here for pt to transfer to [facility].   Pt denies complaints.

## 2017-05-10 NOTE — ED Notes (Signed)
amb to [facility] w/o difficulty 

## 2017-05-10 NOTE — ED Notes (Signed)
ED TO INPATIENT HANDOFF REPORT  Name/Age/Gender Daniel Humphrey 62 y.o. male  Code Status    Code Status Orders  (From admission, onward)        Start     Ordered   05/10/17 1029  Full code  Continuous     05/10/17 1044    Code Status History    Date Active Date Inactive Code Status Order ID Comments User Context   This patient has a current code status but no historical code status.      Home/SNF/Other Home  Chief Complaint abdominal pain  Level of Care/Admitting Diagnosis ED Disposition    ED Disposition Condition [facility] Area: [facility] [100102]  Level of Care: Med-Surg [16]  Diagnosis: Perforated appendix [580998]  Admitting Physician: [place], Clarence  Attending Physician: CCS, MD [[id number]]  Estimated length of stay: past midnight tomorrow  Certification:: I certify this patient will need inpatient services for at least 2 midnights  PT Class (Do Not Modify): Inpatient [101]  PT Acc Code (Do Not Modify): Private [1]       Medical History Past Medical History:  Diagnosis Date  . Aorta disorder ([place])    dilated, followed by MRI's   . Aortic root dilation ([facility])   . Arthritis    OA- R knee, hands   . ED (erectile dysfunction)   . 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 High cholesterol   . OSA (obstructive sleep apnea) 04/15/2015   Severe OSA with AHI 35.2 and successful CPAP titration to 9cmH2O, needs CPAP, will continue to explore   . Seasonal allergies     Allergies No Known Allergies  IV Location/Drains/Wounds Patient Lines/Drains/Airways Status   Active Line/Drains/Airways    Name:   Placement date:   Placement time:   Site:   Days:   Peripheral IV 05/09/17 Left Antecubital   05/09/17    1834    Antecubital   1   AIRWAYS   01/17/15    0824     844          Labs/Imaging Results for orders placed or performed during the hospital encounter of 05/09/17 (from the past 48 hour(s))  Urinalysis, Routine w reflex  microscopic     Status: None   Collection Time: 05/09/17  6:27 PM  Result Value Ref Range   Color, Urine YELLOW YELLOW   APPearance CLEAR CLEAR   Specific Gravity, Urine 1.025 1.005 - 1.030   pH 5.5 5.0 - 8.0   Glucose, UA NEGATIVE NEGATIVE mg/dL   Hgb urine dipstick NEGATIVE NEGATIVE   Bilirubin Urine NEGATIVE NEGATIVE   Ketones, ur NEGATIVE NEGATIVE mg/dL   Protein, ur NEGATIVE NEGATIVE mg/dL   Nitrite NEGATIVE NEGATIVE   Leukocytes, UA NEGATIVE NEGATIVE    Comment: Microscopic not done on urines with negative protein, blood, leukocytes, nitrite, or glucose < 500 mg/dL. Performed at [facility], [address] [place], [place] [address]   Comprehensive metabolic panel     Status: Abnormal   Collection Time: 05/09/17  6:27 PM  Result Value Ref Range   Sodium 139 135 - 145 mmol/L   Potassium 4.0 3.5 - 5.1 mmol/L   Chloride 103 101 - 111 mmol/L   CO2 26 22 - 32 mmol/L   Glucose, Bld 112 (H) 65 - 99 mg/dL   BUN 23 (H) 6 - 20 mg/dL   Creatinine, Ser 1.13 0.61 - 1.24 mg/dL  Calcium 9.0 8.9 - 10.3 mg/dL   Total Protein 7.5 6.5 - 8.1 g/dL   Albumin 3.5 3.5 - 5.0 g/dL   AST 38 15 - 41 U/L   ALT 52 17 - 63 U/L   Alkaline Phosphatase 62 38 - 126 U/L   Total Bilirubin 0.2 (L) 0.3 - 1.2 mg/dL   GFR calc non Af Amer >60 >60 mL/min   GFR calc Af Amer >60 >60 mL/min    Comment: (NOTE) The eGFR has been calculated using the CKD EPI equation. This calculation has not been validated in all clinical situations. eGFR's persistently <60 mL/min signify possible Chronic Kidney Disease.    Anion gap 10 5 - 15    Comment: Performed at [facility], [address] [place], [place] [address]  Lipase, blood     Status: None   Collection Time: 05/09/17  6:27 PM  Result Value Ref Range   Lipase 30 11 - 51 U/L    Comment: Performed at [facility], [address] [place], [place] [address]  CBC     Status: Abnormal   Collection Time: 05/10/17  8:15 AM   Result Value Ref Range   WBC 8.5 4.0 - 10.5 K/uL   RBC 3.75 (L) 4.22 - 5.81 MIL/uL   Hemoglobin 11.6 (L) 13.0 - 17.0 g/dL   HCT 35.0 (L) 39.0 - 52.0 %   MCV 93.3 78.0 - 100.0 fL   MCH 30.9 26.0 - 34.0 pg   MCHC 33.1 30.0 - 36.0 g/dL   RDW 12.9 11.5 - 15.5 %   Platelets 326 150 - 400 K/uL    Comment: Performed at [facility], [address] [place], [place] [address]  Basic metabolic panel     Status: Abnormal   Collection Time: 05/10/17  8:15 AM  Result Value Ref Range   Sodium 135 135 - 145 mmol/L   Potassium 4.3 3.5 - 5.1 mmol/L   Chloride 102 101 - 111 mmol/L   CO2 26 22 - 32 mmol/L   Glucose, Bld 101 (H) 65 - 99 mg/dL   BUN 18 6 - 20 mg/dL   Creatinine, Ser 1.04 0.61 - 1.24 mg/dL   Calcium 8.7 (L) 8.9 - 10.3 mg/dL   GFR calc non Af Amer >60 >60 mL/min   GFR calc Af Amer >60 >60 mL/min    Comment: (NOTE) The eGFR has been calculated using the CKD EPI equation. This calculation has not been validated in all clinical situations. eGFR's persistently <60 mL/min signify possible Chronic Kidney Disease.    Anion gap 7 5 - 15    Comment: Performed at [facility], [address] [place], [place] [address]  Protime-INR     Status: None   Collection Time: 05/10/17  8:15 AM  Result Value Ref Range   Prothrombin Time 14.3 11.4 - 15.2 seconds   INR 1.12     Comment: Performed at [facility], [address] [place], [place] [address]   Ct Abdomen Pelvis W Contrast  Result Date: 05/09/2017 CLINICAL DATA:  62 year old male with right lower quadrant abdominal pain. EXAM: CT ABDOMEN AND PELVIS WITH CONTRAST TECHNIQUE: Multidetector CT imaging of the abdomen and pelvis was performed using the standard protocol following bolus administration of intravenous contrast. CONTRAST:  1100m ISOVUE-300 IOPAMIDOL (ISOVUE-300) INJECTION 61% COMPARISON:  None. FINDINGS: Lower chest: The visualized lung bases are clear. No intra-abdominal free air or  free fluid.  Hepatobiliary: No focal liver abnormality is seen. No gallstones, gallbladder wall thickening, or biliary dilatation. Pancreas: Unremarkable. No pancreatic ductal dilatation or surrounding inflammatory changes. Spleen: Normal in size without focal abnormality. Adrenals/Urinary Tract: Adrenal glands are unremarkable. Kidneys are normal, without renal calculi, focal lesion, or hydronephrosis. Bladder is unremarkable. Stomach/Bowel: There is no bowel obstruction. There is inflammatory changes and thickening of the appendix extending to the base of the cecum. There is a 5.7 x 4.8 x 6.5 cm fluid collection in the tip of the appendix which may represent an abscess or a loculated perforation. The appendix is located the right lower quadrant inferior to the cecum and extending into the right hemipelvis. The appendix measures approximately 2 cm in thickness. Vascular/Lymphatic: Mild aortoiliac atherosclerotic disease. The IVC appears unremarkable. No portal venous gas. There is no adenopathy. Reproductive: The prostate and seminal vesicles are grossly unremarkable. Other: None Musculoskeletal: Mild degenerative changes of the spine. No acute osseous pathology. Probable L3 hemangioma. IMPRESSION: Acute appendicitis with appendiceal abscess or loculated perforation. These results were called by telephone at the time of interpretation on 05/09/2017 at 10:48 pm to Dr. Sherwood Gambler , who verbally acknowledged these results. Electronically Signed   By: Anner Crete M.D.   On: 05/09/2017 22:49    Pending Labs Unresulted Labs (From admission, onward)   Start     Ordered   05/11/17 7035  Basic metabolic panel  Tomorrow morning,   R     05/10/17 1044   05/11/17 0500  CBC  Tomorrow morning,   R     05/10/17 1044   05/11/17 0500  HIV antibody (Routine Testing)  Tomorrow morning,   R     05/10/17 1045      Vitals/Pain Today's Vitals   05/10/17 1117 05/10/17 1119 05/10/17 1200 05/10/17 1400  BP: 121/80  129/75  123/73  Pulse: 85  84 78  Resp: (!) 21  (!) 24 20  Temp:      TempSrc:      SpO2: 98%  99% 99%  Weight:      Height:      PainSc:  6       Isolation Precautions No active isolations  Medications Medications  dextrose 5 % and 0.9 % NaCl with KCl 20 mEq/L infusion ( Intravenous New Bag/Given 05/10/17 1344)  piperacillin-tazobactam (ZOSYN) IVPB 3.375 g (not administered)  morphine 2 MG/ML injection 2-3 mg (not administered)  acetaminophen (TYLENOL) tablet 650 mg (not administered)    Or  acetaminophen (TYLENOL) suppository 650 mg (not administered)  ibuprofen (ADVIL,MOTRIN) tablet 600 mg (not administered)  diphenhydrAMINE (BENADRYL) capsule 25 mg (not administered)    Or  diphenhydrAMINE (BENADRYL) injection 25 mg (not administered)  ondansetron (ZOFRAN-ODT) disintegrating tablet 4 mg (not administered)    Or  ondansetron (ZOFRAN) injection 4 mg (not administered)  oxyCODONE-acetaminophen (PERCOCET/ROXICET) 5-325 MG per tablet 1-2 tablet (not administered)  iopamidol (ISOVUE-300) 61 % injection 100 mL (100 mLs Intravenous Contrast Given 05/09/17 2223)  piperacillin-tazobactam (ZOSYN) IVPB 3.375 g (0 g Intravenous Stopped 05/10/17 0050)  0.9 %  sodium chloride infusion ( Intravenous Stopped 05/10/17 1345)  piperacillin-tazobactam (ZOSYN) IVPB 3.375 g (0 g Intravenous Stopped 05/10/17 0910)  ondansetron (ZOFRAN) injection 4 mg (4 mg Intravenous Given 05/10/17 0919)    Mobility walks

## 2017-05-10 NOTE — Consult Note (Signed)
Chief Complaint: Patient was seen in consultation today for CT-guided drainage of appendiceal abscess Chief Complaint  Patient presents with  . Abdominal Pain    Referring Physician(s): Jennings,W,PAC(CCS)  Supervising Physician: Sandi Mariscal  Patient Status: [facility] - ED  History of Present Illness: Daniel Humphrey is a 62 y.o. male with several day history of right lower quadrant abdominal pain along with nausea and low-grade fevers recently transferred from [facility] with CT findings of acute appendicitis with appendiceal abscess or loculated perforation.  Request now received from surgery for image guided drainage of the abscess.  Past Medical History:  Diagnosis Date  . 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 Aortic root dilation ([facility])   . Arthritis    OA- R knee, hands   . ED (erectile dysfunction)   . 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 High cholesterol   . OSA (obstructive sleep apnea) 04/15/2015   Severe OSA with AHI 35.2 and successful CPAP titration to 9cmH2O, needs CPAP, will continue to explore   . Seasonal allergies     Past Surgical History:  Procedure Laterality Date  . CERVICAL FUSION  1999  . COLONOSCOPY WITH PROPOFOL  04/19/2012   Procedure: COLONOSCOPY WITH PROPOFOL;  Surgeon: Jeryl Columbia, MD;  Location: [facility] ENDOSCOPY;  Service: Endoscopy;  Laterality: N/A;  . COLONOSCOPY WITH PROPOFOL N/A 01/17/2015   Procedure: COLONOSCOPY WITH PROPOFOL;  Surgeon: Clarene Essex, MD;  Location: [facility] ENDOSCOPY;  Service: Endoscopy;  Laterality: N/A;  . HOT HEMOSTASIS  04/19/2012   Procedure: HOT HEMOSTASIS (ARGON PLASMA COAGULATION/BICAP);  Surgeon: Jeryl Columbia, MD;  Location: [facility];  Service: Endoscopy;  Laterality: N/A;  . HOT HEMOSTASIS N/A 01/17/2015   Procedure: HOT HEMOSTASIS (ARGON PLASMA COAGULATION/BICAP);  Surgeon: Clarene Essex, MD;  Location: [facility];  Service: Endoscopy;  Laterality: N/A;  . NASAL SEPTUM SURGERY  1998     Allergies: Patient has no known allergies.  Medications: Prior to Admission medications   Medication Sig Start Date End Date Taking? Authorizing Provider  aspirin 81 MG tablet Take 81 mg by mouth daily.   Yes [provider]  cetirizine-pseudoephedrine (ZYRTEC-D) 5-120 MG per tablet Take 1 tablet by mouth at bedtime.   Yes [provider]  fluticasone (FLONASE) 50 MCG/ACT nasal spray Place 1 spray into the nose daily.   Yes [provider]  ibuprofen (ADVIL,MOTRIN) 200 MG tablet Take 200-400 mg by mouth every 6 (six) hours as needed for headache or mild pain.    Yes [provider]  Multiple Vitamin (MULTIVITAMIN WITH MINERALS) TABS Take 1 tablet by mouth daily.   Yes [provider]  simvastatin (ZOCOR) 10 MG tablet Take 10 mg by mouth daily.    Yes [provider]     Family History  Problem Relation Age of Onset  . Heart attack Mother   . Hyperlipidemia Mother   . Heart attack Father   . Hyperlipidemia Father   . Hypertension Sister   . Heart attack Brother   . Heart disease Brother   . Heart attack Brother   . Heart disease Brother     Social History   Socioeconomic History  . Marital status: Married    Spouse name: None  . Number of children: None  . Years of education: None  . Highest education level: None  Social Needs  . Financial resource strain: None  . Food insecurity - worry: None  .  Food insecurity - inability: None  . Transportation needs - medical: None  . Transportation needs - non-medical: None  Occupational History  . None  Tobacco Use  . Smoking status: Former Smoker    Packs/day: 1.00    Years: 3.00    Pack years: 3.00    Last attempt to quit: 07/14/1987    Years since quitting: 29.8  . Smokeless tobacco: Never Used  Substance and Sexual Activity  . Alcohol use: Yes    Comment: rare  . Drug use: No  . Sexual activity: None  Other Topics Concern  . None  Social History Narrative  .  None      Review of Systems see above; currently denies chest pain, dyspnea, cough, back pain, vomiting or abnormal bleeding.  Does have occasional headaches.  Vital Signs: BP (!) 149/91   Pulse 86   Temp 98.4 F (36.9 C) (Oral)   Resp 19   Ht 5\' 11"  (1.803 m)   Wt 219 lb (99.3 kg)   SpO2 97%   BMI 30.54 kg/m   Physical Exam awake, alert.  Chest clear to auscultation bilaterally.  Heart with regular rate and rhythm.  Abdomen soft, positive bowel sounds, mild right lower quadrant tenderness to palpation, no lower extremity edema.  Imaging: Ct Abdomen Pelvis W Contrast  Result Date: 05/09/2017 CLINICAL DATA:  62 year old male with right lower quadrant abdominal pain. EXAM: CT ABDOMEN AND PELVIS WITH CONTRAST TECHNIQUE: Multidetector CT imaging of the abdomen and pelvis was performed using the standard protocol following bolus administration of intravenous contrast. CONTRAST:  145mL ISOVUE-300 IOPAMIDOL (ISOVUE-300) INJECTION 61% COMPARISON:  None. FINDINGS: Lower chest: The visualized lung bases are clear. No intra-abdominal free air or free fluid. Hepatobiliary: No focal liver abnormality is seen. No gallstones, gallbladder wall thickening, or biliary dilatation. Pancreas: Unremarkable. No pancreatic ductal dilatation or surrounding inflammatory changes. Spleen: Normal in size without focal abnormality. Adrenals/Urinary Tract: Adrenal glands are unremarkable. Kidneys are normal, without renal calculi, focal lesion, or hydronephrosis. Bladder is unremarkable. Stomach/Bowel: There is no bowel obstruction. There is inflammatory changes and thickening of the appendix extending to the base of the cecum. There is a 5.7 x 4.8 x 6.5 cm fluid collection in the tip of the appendix which may represent an abscess or a loculated perforation. The appendix is located the right lower quadrant inferior to the cecum and extending into the right hemipelvis. The appendix measures approximately 2 cm in thickness.  Vascular/Lymphatic: Mild aortoiliac atherosclerotic disease. The IVC appears unremarkable. No portal venous gas. There is no adenopathy. Reproductive: The prostate and seminal vesicles are grossly unremarkable. Other: None Musculoskeletal: Mild degenerative changes of the spine. No acute osseous pathology. Probable L3 hemangioma. IMPRESSION: Acute appendicitis with appendiceal abscess or loculated perforation. These results were called by telephone at the time of interpretation on 05/09/2017 at 10:48 pm to Dr. Sherwood Gambler , who verbally acknowledged these results. Electronically Signed   By: Anner Crete M.D.   On: 05/09/2017 22:49    Labs:  CBC: Recent Labs    05/06/17 1514 05/10/17 0815  WBC 11.1* 8.5  HGB 12.4* 11.6*  HCT 37.7* 35.0*  PLT 305 326    COAGS: Recent Labs    05/06/17 1514 05/10/17 0815  INR 1.09 1.12  APTT 35  --     BMP: Recent Labs    05/06/17 1514 05/09/17 1827 05/10/17 0815  NA 139 139 135  K 4.0 4.0 4.3  CL 102 103 102  CO2 24  26 26  GLUCOSE 108* 112* 101*  BUN 18 23* 18  CALCIUM 8.9 9.0 8.7*  CREATININE 0.97 1.13 1.04  GFRNONAA >60 >60 >60  GFRAA >60 >60 >60    LIVER FUNCTION TESTS: Recent Labs    05/06/17 1514 05/09/17 1827  BILITOT 0.3 0.2*  AST 32 38  ALT 37 52  ALKPHOS 64 62  PROT 7.4 7.5  ALBUMIN 3.4* 3.5    TUMOR MARKERS: No results for input(s): AFPTM, CEA, CA199, CHROMGRNA in the last 8760 hours.  Assessment and Plan: 62 y.o. male with several day history of right lower quadrant abdominal pain along with nausea and low-grade fevers recently transferred from [facility] with CT findings of acute appendicitis with appendiceal abscess or loculated perforation.  Request now received from surgery for image guided drainage of the abscess.  Current labs include WBC 8.5, hemoglobin 11.6, platelets 326k, creatinine 1.04, PT 14.3, INR 1.12.  Imaging studies have been reviewed by Dr. Pascal Lux and abscess appears amenable to  drainage.Risks and benefits discussed with the patient/spouse including bleeding, infection, damage to adjacent structures, bowel perforation/fistula connection, and sepsis.  All of the patient's questions were answered, patient is agreeable to proceed. Consent signed and in chart.  Procedure scheduled for later this afternoon.  Would hold all anticoagulants until after above procedure.   Thank you for this interesting consult.  I greatly enjoyed meeting Sylar Voong [facility] and look forward to participating in their care.  A copy of this report was sent to the requesting provider on this date.  Electronically Signed: D. Rowe Robert, PA-C 05/10/2017, 10:46 AM   I spent a total of 30 minutes in face to face in clinical consultation, greater than 50% of which was counseling/coordinating care for CT-guided drainage of appendiceal abscess

## 2017-05-10 NOTE — ED Provider Notes (Signed)
Patient transferred from [facility] for surgery/IR evaluation for acute appendicitis with abscess.  On arrival patient states pain is controlled, does have nausea. On exam he is nontoxic appearing with moderate RLQ tenderness.  Providing MIVF, antiemetic.  Surgery consulted.     Quintella Reichert, MD 05/10/17 [phone]

## 2017-05-11 ENCOUNTER — Other Ambulatory Visit: Payer: Self-pay

## 2017-05-11 LAB — CBC
HCT: 35 % — ABNORMAL LOW (ref 39.0–52.0)
Hemoglobin: 11.6 g/dL — ABNORMAL LOW (ref 13.0–17.0)
MCH: 31.5 pg (ref 26.0–34.0)
MCHC: 33.1 g/dL (ref 30.0–36.0)
MCV: 95.1 fL (ref 78.0–100.0)
PLATELETS: 384 10*3/uL (ref 150–400)
RBC: 3.68 MIL/uL — ABNORMAL LOW (ref 4.22–5.81)
RDW: 13.1 % (ref 11.5–15.5)
WBC: 8.5 10*3/uL (ref 4.0–10.5)

## 2017-05-11 LAB — BASIC METABOLIC PANEL
Anion gap: 6 (ref 5–15)
BUN: 12 mg/dL (ref 6–20)
CO2: 27 mmol/L (ref 22–32)
CREATININE: 1.02 mg/dL (ref 0.61–1.24)
Calcium: 8.6 mg/dL — ABNORMAL LOW (ref 8.9–10.3)
Chloride: 104 mmol/L (ref 101–111)
GFR calc Af Amer: >60 mL/min (ref >60)
GLUCOSE: 139 mg/dL — AB (ref 65–99)
Potassium: 4.7 mmol/L (ref 3.5–5.1)
SODIUM: 137 mmol/L (ref 135–145)

## 2017-05-11 LAB — HIV ANTIBODY (ROUTINE TESTING W REFLEX): HIV Screen 4th Generation wRfx: NONREACTIVE

## 2017-05-11 MED ORDER — SODIUM CHLORIDE 0.9% FLUSH
5.0000 mL | Freq: Three times a day (TID) | INTRAVENOUS | Status: DC
  Administered 2017-05-11 – 2017-05-13 (×7): 5 mL

## 2017-05-11 NOTE — Progress Notes (Signed)
Referring Physician(s): Newman,D  Supervising Physician: Sandi Mariscal  Patient Status:  [facility] - In-pt  Chief Complaint:  Right lower quadrant abdominal pain  Subjective: Patient doing okay today; has some mild tenderness over right lower quadrant drain site; denies nausea or vomiting; has ambulated; passing gas   Allergies: Patient has no known allergies.  Medications: Prior to Admission medications   Medication Sig Start Date End Date Taking? Authorizing Provider  aspirin 81 MG tablet Take 81 mg by mouth daily.   Yes [provider]  cetirizine-pseudoephedrine (ZYRTEC-D) 5-120 MG per tablet Take 1 tablet by mouth at bedtime.   Yes [provider]  fluticasone (FLONASE) 50 MCG/ACT nasal spray Place 1 spray into the nose daily.   Yes [provider]  ibuprofen (ADVIL,MOTRIN) 200 MG tablet Take 200-400 mg by mouth every 6 (six) hours as needed for headache or mild pain.    Yes [provider]  Multiple Vitamin (MULTIVITAMIN WITH MINERALS) TABS Take 1 tablet by mouth daily.   Yes [provider]  simvastatin (ZOCOR) 10 MG tablet Take 10 mg by mouth daily.    Yes [provider]     Vital Signs: BP 128/80 (BP Location: Right Arm)   Pulse 80   Temp 98.6 F (37 C) (Oral)   Resp 17   Ht 5\' 11"  (1.803 m)   Wt 219 lb (99.3 kg)   SpO2 96%   BMI 30.54 kg/m   Physical Exam awake, alert.  Right lower quadrant drain intact, insertion site okay, mildly tender.  Small amount of bloody fluid in JP bulb.  Imaging: Ct Abdomen Pelvis W Contrast  Result Date: 05/09/2017 CLINICAL DATA:  62 year old male with right lower quadrant abdominal pain. EXAM: CT ABDOMEN AND PELVIS WITH CONTRAST TECHNIQUE: Multidetector CT imaging of the abdomen and pelvis was performed using the standard protocol following bolus administration of intravenous contrast. CONTRAST:  136mL ISOVUE-300 IOPAMIDOL (ISOVUE-300) INJECTION 61% COMPARISON:  None. FINDINGS:  Lower chest: The visualized lung bases are clear. No intra-abdominal free air or free fluid. Hepatobiliary: No focal liver abnormality is seen. No gallstones, gallbladder wall thickening, or biliary dilatation. Pancreas: Unremarkable. No pancreatic ductal dilatation or surrounding inflammatory changes. Spleen: Normal in size without focal abnormality. Adrenals/Urinary Tract: Adrenal glands are unremarkable. Kidneys are normal, without renal calculi, focal lesion, or hydronephrosis. Bladder is unremarkable. Stomach/Bowel: There is no bowel obstruction. There is inflammatory changes and thickening of the appendix extending to the base of the cecum. There is a 5.7 x 4.8 x 6.5 cm fluid collection in the tip of the appendix which may represent an abscess or a loculated perforation. The appendix is located the right lower quadrant inferior to the cecum and extending into the right hemipelvis. The appendix measures approximately 2 cm in thickness. Vascular/Lymphatic: Mild aortoiliac atherosclerotic disease. The IVC appears unremarkable. No portal venous gas. There is no adenopathy. Reproductive: The prostate and seminal vesicles are grossly unremarkable. Other: None Musculoskeletal: Mild degenerative changes of the spine. No acute osseous pathology. Probable L3 hemangioma. IMPRESSION: Acute appendicitis with appendiceal abscess or loculated perforation. These results were called by telephone at the time of interpretation on 05/09/2017 at 10:48 pm to Dr. Sherwood Gambler , who verbally acknowledged these results. Electronically Signed   By: Anner Crete M.D.   On: 05/09/2017 22:49   Ct Image Guided Drainage By Percutaneous Catheter  Result Date: 05/10/2017 INDICATION: Right lower quadrant abdominal pain, concern for ruptured appendicitis. Please perform CT-guided percutaneous drainage catheter placement  for infection source control purposes. EXAM: CT IMAGE GUIDED DRAINAGE BY PERCUTANEOUS CATHETER COMPARISON:  CT  abdomen pelvis - 05/09/2017 MEDICATIONS: The patient is currently admitted to the hospital and receiving intravenous antibiotics. The antibiotics were administered within an appropriate time frame prior to the initiation of the procedure. ANESTHESIA/SEDATION: Moderate (conscious) sedation was employed during this procedure. A total of Versed 2 mg and Fentanyl 100 mcg was administered intravenously. Moderate Sedation Time: 23 minutes. The patient's level of consciousness and vital signs were monitored continuously by radiology nursing throughout the procedure under my direct supervision. CONTRAST:  None COMPLICATIONS: None immediate. PROCEDURE: Informed written consent was obtained from the patient after a discussion of the risks, benefits and alternatives to treatment. The patient was placed supine on the CT gantry and a pre procedural CT was performed re-demonstrating the known abscess/fluid collection within the right lower abdominal quadrant with dominant component measuring approximately 6.0 x 5.0 cm (image 17, series 2). The procedure was planned. A timeout was performed prior to the initiation of the procedure. The skin overlying the ventral aspect the right lower abdomen was prepped and draped in the usual sterile fashion. The overlying soft tissues were anesthetized with 1% lidocaine with epinephrine. Appropriate trajectory was planned with the use of a 22 gauge spinal needle. An 18 gauge trocar needle was advanced into the abscess/fluid collection and a short Amplatz super stiff wire was coiled within the collection. Appropriate positioning was confirmed with a limited CT scan. The tract was serially dilated allowing placement of a 12 [place] all-purpose drainage catheter. Appropriate positioning was confirmed with a limited postprocedural CT scan. Despite appropriate positioning of the percutaneous drainage catheter, very little amount of fluid was able to be aspirated from collection. As such, approximately  10 cc of saline was utilized to irrigate the collection ultimately resulting in the evacuation of approximately 80 cc of viscous slightly blood tinged non foul smelling fluid. All fluid was capped and sent to the laboratory for Gram stain and cytologic analysis. The tube was connected to a JP bulb and sutured in place. A dressing was placed. The patient tolerated the procedure well without immediate post procedural complication. IMPRESSION: Successful CT guided placement of a 16 French all purpose drain catheter into the right lower quadrant periappendiceal fluid collection with aspiration of 80 cc of viscous, slightly blood tinged non foul smelling fluid. Samples were sent to the laboratory for both Gram stain and cytologic analysis, as the viscous contents of this collection could be seen in the setting of a mucocele. Electronically Signed   By: Sandi Mariscal M.D.   On: 05/10/2017 17:43    Labs:  CBC: Recent Labs    05/06/17 1514 05/10/17 0815 05/11/17 0418  WBC 11.1* 8.5 8.5  HGB 12.4* 11.6* 11.6*  HCT 37.7* 35.0* 35.0*  PLT 305 326 384    COAGS: Recent Labs    05/06/17 1514 05/10/17 0815  INR 1.09 1.12  APTT 35  --     BMP: Recent Labs    05/06/17 1514 05/09/17 1827 05/10/17 0815 05/11/17 0418  NA 139 139 135 137  K 4.0 4.0 4.3 4.7  CL 102 103 102 104  CO2 24 26 26 27   GLUCOSE 108* 112* 101* 139*  BUN 18 23* 18 12  CALCIUM 8.9 9.0 8.7* 8.6*  CREATININE 0.97 1.13 1.04 1.02  GFRNONAA >60 >60 >60 >60  GFRAA >60 >60 >60 >60    LIVER FUNCTION TESTS: Recent Labs    05/06/17 1514  05/09/17 1827  BILITOT 0.3 0.2*  AST 32 38  ALT 37 52  ALKPHOS 64 62  PROT 7.4 7.5  ALBUMIN 3.4* 3.5    Assessment and Plan: Patient with history of right lower quadrant abdominal pain and abdominal fluid collection, status post drainage on 2/26.  Afebrile, WBC normal, creatinine normal, hemoglobin stable, drain fluid cultures and cytology pending.  Monitor output closely and if minimal  over the next 48 hours despite flushes consider follow-up CT.   Electronically Signed: D. Rowe Robert, PA-C 05/11/2017, 1:48 PM   I spent a total of 15 minutes at the the patient's bedside AND on the patient's hospital floor or unit, greater than 50% of which was counseling/coordinating care for right lower quadrant fluid collection drainage    Patient ID: Daniel Humphrey, male   DOB: 23-Oct-1955, 62 y.o.   MRN: [id number]

## 2017-05-11 NOTE — Progress Notes (Addendum)
[facility] Progress Note     Subjective: CC: abdominal pain Patient still with some mild abdominal pain, mostly around RLQ drain. Does have some diffuse pain with movement. Passing flatus, no BM yet. Concerned that drain really has not put out anything since placement.  UOP good. VSS.   Objective: Vital signs in last 24 hours: Temp:  [98.3 F (36.8 C)-98.9 F (37.2 C)] 98.3 F (36.8 C) (02/27 0610) Pulse Rate:  [77-92] 77 (02/27 0610) Resp:  [18-30] 18 (02/27 0610) BP: (115-143)/(65-87) 134/87 (02/27 0610) SpO2:  [92 %-100 %] 95 % (02/27 0610) Last BM Date: 05/09/17  Intake/Output from previous day: 02/26 0701 - 02/27 0700 In: 2386.7 [P.O.:600; I.V.:1626.7; IV Piggyback:150] Out: 680 [Urine:600; Drains:80] Intake/Output this shift: No intake/output data recorded.  PE: Gen:  Alert, NAD, pleasant Card:  Regular rate and rhythm, pedal pulses 2+ BL Pulm:  Normal effort, clear to auscultation bilaterally Abd: Soft, mildly tender mostly in RLQ, mild rebound tenderness, non-distended, bowel sounds present, no HSM, drain in RLQ without drainage Skin: warm and dry, no rashes  Psych: A&Ox3   Lab Results:  Recent Labs    05/10/17 0815 05/11/17 0418  WBC 8.5 8.5  HGB 11.6* 11.6*  HCT 35.0* 35.0*  PLT 326 384   BMET Recent Labs    05/10/17 0815 05/11/17 0418  NA 135 137  K 4.3 4.7  CL 102 104  CO2 26 27  GLUCOSE 101* 139*  BUN 18 12  CREATININE 1.04 1.02  CALCIUM 8.7* 8.6*   PT/INR Recent Labs    05/10/17 0815  LABPROT 14.3  INR 1.12   CMP     Component Value Date/Time   NA 137 05/11/2017 0418   K 4.7 05/11/2017 0418   CL 104 05/11/2017 0418   CO2 27 05/11/2017 0418   GLUCOSE 139 (H) 05/11/2017 0418   BUN 12 05/11/2017 0418   CREATININE 1.02 05/11/2017 0418   CALCIUM 8.6 (L) 05/11/2017 0418   PROT 7.5 05/09/2017 1827   ALBUMIN 3.5 05/09/2017 1827   AST 38 05/09/2017 1827   ALT 52 05/09/2017 1827   ALKPHOS 62 05/09/2017 1827   BILITOT  0.2 (L) 05/09/2017 1827   GFRNONAA >60 05/11/2017 0418   GFRAA >60 05/11/2017 0418   Lipase     Component Value Date/Time   LIPASE 30 05/09/2017 1827       Studies/Results: Ct Abdomen Pelvis W Contrast  Result Date: 05/09/2017 CLINICAL DATA:  62 year old male with right lower quadrant abdominal pain. EXAM: CT ABDOMEN AND PELVIS WITH CONTRAST TECHNIQUE: Multidetector CT imaging of the abdomen and pelvis was performed using the standard protocol following bolus administration of intravenous contrast. CONTRAST:  134mL ISOVUE-300 IOPAMIDOL (ISOVUE-300) INJECTION 61% COMPARISON:  None. FINDINGS: Lower chest: The visualized lung bases are clear. No intra-abdominal free air or free fluid. Hepatobiliary: No focal liver abnormality is seen. No gallstones, gallbladder wall thickening, or biliary dilatation. Pancreas: Unremarkable. No pancreatic ductal dilatation or surrounding inflammatory changes. Spleen: Normal in size without focal abnormality. Adrenals/Urinary Tract: Adrenal glands are unremarkable. Kidneys are normal, without renal calculi, focal lesion, or hydronephrosis. Bladder is unremarkable. Stomach/Bowel: There is no bowel obstruction. There is inflammatory changes and thickening of the appendix extending to the base of the cecum. There is a 5.7 x 4.8 x 6.5 cm fluid collection in the tip of the appendix which may represent an abscess or a loculated perforation. The appendix is located the right lower quadrant inferior to the cecum and extending into  the right hemipelvis. The appendix measures approximately 2 cm in thickness. Vascular/Lymphatic: Mild aortoiliac atherosclerotic disease. The IVC appears unremarkable. No portal venous gas. There is no adenopathy. Reproductive: The prostate and seminal vesicles are grossly unremarkable. Other: None Musculoskeletal: Mild degenerative changes of the spine. No acute osseous pathology. Probable L3 hemangioma. IMPRESSION: Acute appendicitis with appendiceal  abscess or loculated perforation. These results were called by telephone at the time of interpretation on 05/09/2017 at 10:48 pm to Dr. Sherwood Gambler , who verbally acknowledged these results. Electronically Signed   By: Anner Crete M.D.   On: 05/09/2017 22:49   Ct Image Guided Drainage By Percutaneous Catheter  Result Date: 05/10/2017 INDICATION: Right lower quadrant abdominal pain, concern for ruptured appendicitis. Please perform CT-guided percutaneous drainage catheter placement for infection source control purposes. EXAM: CT IMAGE GUIDED DRAINAGE BY PERCUTANEOUS CATHETER COMPARISON:  CT abdomen pelvis - 05/09/2017 MEDICATIONS: The patient is currently admitted to the hospital and receiving intravenous antibiotics. The antibiotics were administered within an appropriate time frame prior to the initiation of the procedure. ANESTHESIA/SEDATION: Moderate (conscious) sedation was employed during this procedure. A total of Versed 2 mg and Fentanyl 100 mcg was administered intravenously. Moderate Sedation Time: 23 minutes. The patient's level of consciousness and vital signs were monitored continuously by radiology nursing throughout the procedure under my direct supervision. CONTRAST:  None COMPLICATIONS: None immediate. PROCEDURE: Informed written consent was obtained from the patient after a discussion of the risks, benefits and alternatives to treatment. The patient was placed supine on the CT gantry and a pre procedural CT was performed re-demonstrating the known abscess/fluid collection within the right lower abdominal quadrant with dominant component measuring approximately 6.0 x 5.0 cm (image 17, series 2). The procedure was planned. A timeout was performed prior to the initiation of the procedure. The skin overlying the ventral aspect the right lower abdomen was prepped and draped in the usual sterile fashion. The overlying soft tissues were anesthetized with 1% lidocaine with epinephrine. Appropriate  trajectory was planned with the use of a 22 gauge spinal needle. An 18 gauge trocar needle was advanced into the abscess/fluid collection and a short Amplatz super stiff wire was coiled within the collection. Appropriate positioning was confirmed with a limited CT scan. The tract was serially dilated allowing placement of a 12 [place] all-purpose drainage catheter. Appropriate positioning was confirmed with a limited postprocedural CT scan. Despite appropriate positioning of the percutaneous drainage catheter, very little amount of fluid was able to be aspirated from collection. As such, approximately 10 cc of saline was utilized to irrigate the collection ultimately resulting in the evacuation of approximately 80 cc of viscous slightly blood tinged non foul smelling fluid. All fluid was capped and sent to the laboratory for Gram stain and cytologic analysis. The tube was connected to a JP bulb and sutured in place. A dressing was placed. The patient tolerated the procedure well without immediate post procedural complication. IMPRESSION: Successful CT guided placement of a 44 French all purpose drain catheter into the right lower quadrant periappendiceal fluid collection with aspiration of 80 cc of viscous, slightly blood tinged non foul smelling fluid. Samples were sent to the laboratory for both Gram stain and cytologic analysis, as the viscous contents of this collection could be seen in the setting of a mucocele. Electronically Signed   By: Sandi Mariscal M.D.   On: 05/10/2017 17:43    Anti-infectives: Anti-infectives (From admission, onward)   Start     Dose/Rate Route Frequency  Ordered Stop   05/10/17 1600  piperacillin-tazobactam (ZOSYN) IVPB 3.375 g     3.375 g 12.5 mL/hr over 240 Minutes Intravenous Every 8 hours 05/10/17 1044     05/10/17 0800  piperacillin-tazobactam (ZOSYN) IVPB 3.375 g     3.375 g 100 mL/hr over 30 Minutes Intravenous  Once 05/10/17 0752 05/10/17 0910   05/09/17 2300   piperacillin-tazobactam (ZOSYN) IVPB 3.375 g     3.375 g 100 mL/hr over 30 Minutes Intravenous  Once 05/09/17 2252 05/10/17 0050       Assessment/Plan OSA Dilated aorta - stable HLD OA Hx of hep A  Perforated appendicitis S/P percutaneous drainage 05/10/17 - WBC 8.5, afebrile, not tachycardic - drain with 80 cc aspirated on insertion yesterday, no further output - likely needs to be flushed, if not working after that may need to repeat CT - cx pending - tolerating CLD, advance to FLD  FEN: FLD VTE: SCDs, lovenox ID: IV zosyn 2/25>>  LOS: 2 days    Brigid Re , [facility] Surgery 05/11/2017, 9:36 AM Pager: [phone] Consults: [phone]  Agree with above. Spoke to Dr. Jeannett Senior today - some concern that this may not be an appendiceal abscess,but possibly a mucocele. Will follow for now. He is sore, but doing okay. 80 cc recorded in drain last 24 hours.  Wife, Abigail Butts, in the room. She had her esophagus stretched and Botox injected in her esophagus at [facility] yesterday for swallowing difficulties.  Alphonsa Overall, MD, [facility] Surgery Pager: [phone] Office phone:  [phone]

## 2017-05-12 LAB — CBC
HEMATOCRIT: 36.9 % — AB (ref 39.0–52.0)
HEMOGLOBIN: 12.1 g/dL — AB (ref 13.0–17.0)
MCH: 31.3 pg (ref 26.0–34.0)
MCHC: 32.8 g/dL (ref 30.0–36.0)
MCV: 95.3 fL (ref 78.0–100.0)
Platelets: 386 10*3/uL (ref 150–400)
RBC: 3.87 MIL/uL — AB (ref 4.22–5.81)
RDW: 13.1 % (ref 11.5–15.5)
WBC: 5.9 10*3/uL (ref 4.0–10.5)

## 2017-05-12 NOTE — Progress Notes (Signed)
Referring Physician(s): Newman,D  Supervising Physician: Sandi Mariscal  Patient Status:  [facility] - In-pt  Chief Complaint:  Right lower quadrant abdominal pain    Subjective: Pt doing ok; has had BM, passing gas; denies N/V; some soreness at abd drain site   Allergies: Patient has no known allergies.  Medications: Prior to Admission medications   Medication Sig Start Date End Date Taking? Authorizing Provider  aspirin 81 MG tablet Take 81 mg by mouth daily.   Yes [provider]  cetirizine-pseudoephedrine (ZYRTEC-D) 5-120 MG per tablet Take 1 tablet by mouth at bedtime.   Yes [provider]  fluticasone (FLONASE) 50 MCG/ACT nasal spray Place 1 spray into the nose daily.   Yes [provider]  ibuprofen (ADVIL,MOTRIN) 200 MG tablet Take 200-400 mg by mouth every 6 (six) hours as needed for headache or mild pain.    Yes [provider]  Multiple Vitamin (MULTIVITAMIN WITH MINERALS) TABS Take 1 tablet by mouth daily.   Yes [provider]  simvastatin (ZOCOR) 10 MG tablet Take 10 mg by mouth daily.    Yes [provider]     Vital Signs: BP (!) 152/78 (BP Location: Right Arm)   Pulse 73   Temp 97.8 F (36.6 C) (Oral)   Resp 18   Ht 5\' 11"  (1.803 m)   Wt 219 lb (99.3 kg)   SpO2 95%   BMI 30.54 kg/m   Physical Exam rt abd drain intact, output 15 cc today, 45 cc yesterday blood-tinged fluid; dressing clean and dry, mildly tender  Imaging: Ct Abdomen Pelvis W Contrast  Result Date: 05/09/2017 CLINICAL DATA:  62 year old male with right lower quadrant abdominal pain. EXAM: CT ABDOMEN AND PELVIS WITH CONTRAST TECHNIQUE: Multidetector CT imaging of the abdomen and pelvis was performed using the standard protocol following bolus administration of intravenous contrast. CONTRAST:  180mL ISOVUE-300 IOPAMIDOL (ISOVUE-300) INJECTION 61% COMPARISON:  None. FINDINGS: Lower chest: The visualized lung bases are clear. No  intra-abdominal free air or free fluid. Hepatobiliary: No focal liver abnormality is seen. No gallstones, gallbladder wall thickening, or biliary dilatation. Pancreas: Unremarkable. No pancreatic ductal dilatation or surrounding inflammatory changes. Spleen: Normal in size without focal abnormality. Adrenals/Urinary Tract: Adrenal glands are unremarkable. Kidneys are normal, without renal calculi, focal lesion, or hydronephrosis. Bladder is unremarkable. Stomach/Bowel: There is no bowel obstruction. There is inflammatory changes and thickening of the appendix extending to the base of the cecum. There is a 5.7 x 4.8 x 6.5 cm fluid collection in the tip of the appendix which may represent an abscess or a loculated perforation. The appendix is located the right lower quadrant inferior to the cecum and extending into the right hemipelvis. The appendix measures approximately 2 cm in thickness. Vascular/Lymphatic: Mild aortoiliac atherosclerotic disease. The IVC appears unremarkable. No portal venous gas. There is no adenopathy. Reproductive: The prostate and seminal vesicles are grossly unremarkable. Other: None Musculoskeletal: Mild degenerative changes of the spine. No acute osseous pathology. Probable L3 hemangioma. IMPRESSION: Acute appendicitis with appendiceal abscess or loculated perforation. These results were called by telephone at the time of interpretation on 05/09/2017 at 10:48 pm to Dr. Sherwood Gambler , who verbally acknowledged these results. Electronically Signed   By: Anner Crete M.D.   On: 05/09/2017 22:49   Ct Image Guided Drainage By Percutaneous Catheter  Result Date: 05/10/2017 INDICATION: Right lower quadrant abdominal pain, concern for ruptured appendicitis. Please perform CT-guided percutaneous drainage catheter placement for infection source control purposes. EXAM:  CT IMAGE GUIDED DRAINAGE BY PERCUTANEOUS CATHETER COMPARISON:  CT abdomen pelvis - 05/09/2017 MEDICATIONS: The patient is  currently admitted to the hospital and receiving intravenous antibiotics. The antibiotics were administered within an appropriate time frame prior to the initiation of the procedure. ANESTHESIA/SEDATION: Moderate (conscious) sedation was employed during this procedure. A total of Versed 2 mg and Fentanyl 100 mcg was administered intravenously. Moderate Sedation Time: 23 minutes. The patient's level of consciousness and vital signs were monitored continuously by radiology nursing throughout the procedure under my direct supervision. CONTRAST:  None COMPLICATIONS: None immediate. PROCEDURE: Informed written consent was obtained from the patient after a discussion of the risks, benefits and alternatives to treatment. The patient was placed supine on the CT gantry and a pre procedural CT was performed re-demonstrating the known abscess/fluid collection within the right lower abdominal quadrant with dominant component measuring approximately 6.0 x 5.0 cm (image 17, series 2). The procedure was planned. A timeout was performed prior to the initiation of the procedure. The skin overlying the ventral aspect the right lower abdomen was prepped and draped in the usual sterile fashion. The overlying soft tissues were anesthetized with 1% lidocaine with epinephrine. Appropriate trajectory was planned with the use of a 22 gauge spinal needle. An 18 gauge trocar needle was advanced into the abscess/fluid collection and a short Amplatz super stiff wire was coiled within the collection. Appropriate positioning was confirmed with a limited CT scan. The tract was serially dilated allowing placement of a 12 [place] all-purpose drainage catheter. Appropriate positioning was confirmed with a limited postprocedural CT scan. Despite appropriate positioning of the percutaneous drainage catheter, very little amount of fluid was able to be aspirated from collection. As such, approximately 10 cc of saline was utilized to irrigate the  collection ultimately resulting in the evacuation of approximately 80 cc of viscous slightly blood tinged non foul smelling fluid. All fluid was capped and sent to the laboratory for Gram stain and cytologic analysis. The tube was connected to a JP bulb and sutured in place. A dressing was placed. The patient tolerated the procedure well without immediate post procedural complication. IMPRESSION: Successful CT guided placement of a 74 French all purpose drain catheter into the right lower quadrant periappendiceal fluid collection with aspiration of 80 cc of viscous, slightly blood tinged non foul smelling fluid. Samples were sent to the laboratory for both Gram stain and cytologic analysis, as the viscous contents of this collection could be seen in the setting of a mucocele. Electronically Signed   By: Sandi Mariscal M.D.   On: 05/10/2017 17:43    Labs:  CBC: Recent Labs    05/06/17 1514 05/10/17 0815 05/11/17 0418 05/12/17 0944  WBC 11.1* 8.5 8.5 5.9  HGB 12.4* 11.6* 11.6* 12.1*  HCT 37.7* 35.0* 35.0* 36.9*  PLT 305 326 384 386    COAGS: Recent Labs    05/06/17 1514 05/10/17 0815  INR 1.09 1.12  APTT 35  --     BMP: Recent Labs    05/06/17 1514 05/09/17 1827 05/10/17 0815 05/11/17 0418  NA 139 139 135 137  K 4.0 4.0 4.3 4.7  CL 102 103 102 104  CO2 24 26 26 27   GLUCOSE 108* 112* 101* 139*  BUN 18 23* 18 12  CALCIUM 8.9 9.0 8.7* 8.6*  CREATININE 0.97 1.13 1.04 1.02  GFRNONAA >60 >60 >60 >60  GFRAA >60 >60 >60 >60    LIVER FUNCTION TESTS: Recent Labs    05/06/17 1514  05/09/17 1827  BILITOT 0.3 0.2*  AST 32 38  ALT 37 52  ALKPHOS 64 62  PROT 7.4 7.5  ALBUMIN 3.4* 3.5    Assessment and Plan: Patient with history of right lower quadrant abdominal pain and abdominal fluid collection, status post drainage on 2/26; afebrile; WBC nl; hgb stable; drain fluid cx- few citrobacter freundii; cytology pend; cont current tx for now     Electronically Signed: D. Rowe Robert, PA-C 05/12/2017, 2:51 PM   I spent a total of 15 minutes at the the patient's bedside AND on the patient's hospital floor or unit, greater than 50% of which was counseling/coordinating care for abdominal abscess drain    Patient ID: Daniel Humphrey, male   DOB: [birth year], 62 y.o.   MRN: [id number]

## 2017-05-12 NOTE — Progress Notes (Addendum)
[facility] Progress Note     Subjective: CC: abdominal pain Slight improvement in pain from yesterday, pain well controlled with medication. Denies nausea or vomiting. Tolerating diet. Passing flatus, no BM.   Colonoscopy report from 2016 located in chart. Discussed with patient that this may not be straight-forward appendicitis, and we are waiting on cytology results to make further plans.  UOP good. VSS.   Objective: Vital signs in last 24 hours: Temp:  [98.5 F (36.9 C)-98.7 F (37.1 C)] 98.6 F (37 C) (02/28 0459) Pulse Rate:  [77-80] 77 (02/28 0459) Resp:  [17-18] 17 (02/28 0459) BP: (128-140)/(78-87) 139/87 (02/28 0459) SpO2:  [95 %-96 %] 95 % (02/28 0459) Last BM Date: 05/10/17  Intake/Output from previous day: 02/27 0701 - 02/28 0700 In: 3715 [P.O.:1200; I.V.:2400; IV Piggyback:100] Out: 9242 [ASTMH:9622; Drains:45] Intake/Output this shift: Total I/O In: 240 [P.O.:240] Out: 200 [Urine:200]  PE: Gen:  Alert, NAD, pleasant Card:  Regular rate and rhythm, pedal pulses 2+ BL Pulm:  Normal effort, clear to auscultation bilaterally Abd: Soft, mildly tender in RLQ, mild rebound tenderness, non-distended, bowel sounds present, no HSM, drain in RLQ with serosanguinous drainage Skin: warm and dry, no rashes  Psych: A&Ox3    Lab Results:  Recent Labs    05/10/17 0815 05/11/17 0418  WBC 8.5 8.5  HGB 11.6* 11.6*  HCT 35.0* 35.0*  PLT 326 384   BMET Recent Labs    05/10/17 0815 05/11/17 0418  NA 135 137  K 4.3 4.7  CL 102 104  CO2 26 27  GLUCOSE 101* 139*  BUN 18 12  CREATININE 1.04 1.02  CALCIUM 8.7* 8.6*   PT/INR Recent Labs    05/10/17 0815  LABPROT 14.3  INR 1.12   CMP     Component Value Date/Time   NA 137 05/11/2017 0418   K 4.7 05/11/2017 0418   CL 104 05/11/2017 0418   CO2 27 05/11/2017 0418   GLUCOSE 139 (H) 05/11/2017 0418   BUN 12 05/11/2017 0418   CREATININE 1.02 05/11/2017 0418   CALCIUM 8.6 (L) 05/11/2017 0418    PROT 7.5 05/09/2017 1827   ALBUMIN 3.5 05/09/2017 1827   AST 38 05/09/2017 1827   ALT 52 05/09/2017 1827   ALKPHOS 62 05/09/2017 1827   BILITOT 0.2 (L) 05/09/2017 1827   GFRNONAA >60 05/11/2017 0418   GFRAA >60 05/11/2017 0418   Lipase     Component Value Date/Time   LIPASE 30 05/09/2017 1827       Studies/Results: Ct Image Guided Drainage By Percutaneous Catheter  Result Date: 05/10/2017 INDICATION: Right lower quadrant abdominal pain, concern for ruptured appendicitis. Please perform CT-guided percutaneous drainage catheter placement for infection source control purposes. EXAM: CT IMAGE GUIDED DRAINAGE BY PERCUTANEOUS CATHETER COMPARISON:  CT abdomen pelvis - 05/09/2017 MEDICATIONS: The patient is currently admitted to the hospital and receiving intravenous antibiotics. The antibiotics were administered within an appropriate time frame prior to the initiation of the procedure. ANESTHESIA/SEDATION: Moderate (conscious) sedation was employed during this procedure. A total of Versed 2 mg and Fentanyl 100 mcg was administered intravenously. Moderate Sedation Time: 23 minutes. The patient's level of consciousness and vital signs were monitored continuously by radiology nursing throughout the procedure under my direct supervision. CONTRAST:  None COMPLICATIONS: None immediate. PROCEDURE: Informed written consent was obtained from the patient after a discussion of the risks, benefits and alternatives to treatment. The patient was placed supine on the CT gantry and a pre procedural CT was performed  re-demonstrating the known abscess/fluid collection within the right lower abdominal quadrant with dominant component measuring approximately 6.0 x 5.0 cm (image 17, series 2). The procedure was planned. A timeout was performed prior to the initiation of the procedure. The skin overlying the ventral aspect the right lower abdomen was prepped and draped in the usual sterile fashion. The overlying soft tissues  were anesthetized with 1% lidocaine with epinephrine. Appropriate trajectory was planned with the use of a 22 gauge spinal needle. An 18 gauge trocar needle was advanced into the abscess/fluid collection and a short Amplatz super stiff wire was coiled within the collection. Appropriate positioning was confirmed with a limited CT scan. The tract was serially dilated allowing placement of a 12 [place] all-purpose drainage catheter. Appropriate positioning was confirmed with a limited postprocedural CT scan. Despite appropriate positioning of the percutaneous drainage catheter, very little amount of fluid was able to be aspirated from collection. As such, approximately 10 cc of saline was utilized to irrigate the collection ultimately resulting in the evacuation of approximately 80 cc of viscous slightly blood tinged non foul smelling fluid. All fluid was capped and sent to the laboratory for Gram stain and cytologic analysis. The tube was connected to a JP bulb and sutured in place. A dressing was placed. The patient tolerated the procedure well without immediate post procedural complication. IMPRESSION: Successful CT guided placement of a 36 French all purpose drain catheter into the right lower quadrant periappendiceal fluid collection with aspiration of 80 cc of viscous, slightly blood tinged non foul smelling fluid. Samples were sent to the laboratory for both Gram stain and cytologic analysis, as the viscous contents of this collection could be seen in the setting of a mucocele. Electronically Signed   By: Sandi Mariscal M.D.   On: 05/10/2017 17:43    Anti-infectives: Anti-infectives (From admission, onward)   Start     Dose/Rate Route Frequency Ordered Stop   05/10/17 1600  piperacillin-tazobactam (ZOSYN) IVPB 3.375 g     3.375 g 12.5 mL/hr over 240 Minutes Intravenous Every 8 hours 05/10/17 1044     05/10/17 0800  piperacillin-tazobactam (ZOSYN) IVPB 3.375 g     3.375 g 100 mL/hr over 30 Minutes  Intravenous  Once 05/10/17 0752 05/10/17 0910   05/09/17 2300  piperacillin-tazobactam (ZOSYN) IVPB 3.375 g     3.375 g 100 mL/hr over 30 Minutes Intravenous  Once 05/09/17 2252 05/10/17 0050       Assessment/Plan OSA Dilated aorta - stable HLD OA Hx of hep A  Perforated appendicitis S/P percutaneous drainage 05/10/17 - WBC 8.5 yesterday, afebrile, not tachycardic - drain with 45 cc out after flushing yesterday  - cytology pending   - tolerating FLD - colonoscopy in 01/2015 with polyps in the cecum and ascending colon - negative for malignancy  FEN: FLD VTE: SCDs, lovenox ID: IV zosyn 2/25>>  Depending on what cytology shows patient may need surgical intervention sooner than initially thought. We will follow up with future plans when cytology back. For now, continue current management  LOS: 3 days    Brigid Re , [facility] Surgery 05/12/2017, 8:31 AM Pager: [phone] Consults: [phone]  Agree with above. Dr. Watt Climes saw the appendiceal orifice in 2016.  Will discuss with him the need for repeat colonoscopy.  Alphonsa Overall, MD, [facility] Surgery Pager: [phone] Office phone:  [phone]

## 2017-05-13 ENCOUNTER — Other Ambulatory Visit: Payer: Self-pay | Admitting: Physician Assistant

## 2017-05-13 DIAGNOSIS — K3532 Acute appendicitis with perforation and localized peritonitis, without abscess: Secondary | ICD-10-CM

## 2017-05-13 MED ORDER — CIPROFLOXACIN HCL 500 MG PO TABS: 500.0000 mg | ORAL_TABLET | Freq: Two times a day (BID) | ORAL | Status: DC

## 2017-05-13 MED ORDER — AMOXICILLIN-POT CLAVULANATE 875-125 MG PO TABS
1.0000 | ORAL_TABLET | Freq: Two times a day (BID) | ORAL | Status: DC
  Administered 2017-05-13: 1 via ORAL
  Filled 2017-05-13: qty 1

## 2017-05-13 MED ORDER — OXYCODONE-ACETAMINOPHEN 5-325 MG PO TABS: 1.0000 | ORAL_TABLET | ORAL | 0 refills | Status: DC | PRN

## 2017-05-13 MED ORDER — AMOXICILLIN-POT CLAVULANATE 875-125 MG PO TABS: 1.0000 | ORAL_TABLET | Freq: Two times a day (BID) | ORAL | 0 refills | Status: AC

## 2017-05-13 MED ORDER — SODIUM CHLORIDE 0.9% FLUSH: 10.0000 mL | INTRAVENOUS | 0 refills | Status: DC | PRN

## 2017-05-13 NOTE — Discharge Summary (Addendum)
[facility] Discharge Summary   Patient ID: Rajveer Handler MRN: [id number] DOB/AGE: 1955/04/19 62 y.o.  Admit date: 05/09/2017 Discharge date: 05/13/2017  Admitting Diagnosis: Perforated appendicitis  Discharge Diagnosis S/P drain placement for perforated appendicits  Consultants Interventional radiology   Imaging: No results found.  Procedures Dr. Pascal Lux (05/10/17) - CT guided placement of drainage catheter  Hospital Course:  Patient is a 61 year old male who presented to [facility] ED with RLQ pain.  Workup showed acute appendicitis with rupture and periappendiceal abscess.  Patient was transferred over to [facility], admitted and underwent procedure listed above.  Tolerated procedure well and was transferred to the floor.  Diet was advanced as tolerated. There was some concern for possible mucocele given viscosity of aspirate, but cytology showed inflammatory cells without signs of malignancy.   On 05/13/17, the patient was voiding well, tolerating diet, ambulating well, pain well controlled, vital signs stable and felt stable for discharge home with drain and PO antibiotics.  Patient will follow up in our office in 4-5 weeks to discuss interval appendectomy. He knows to call with questions or concerns.  He will call to confirm appointment date/time.    I have personally looked this patient up in the [place] Controlled Substance Database and reviewed their medications.  Allergies as of 05/13/2017   No Known Allergies     Medication List    STOP taking these medications   simvastatin 10 MG tablet Commonly known as:  ZOCOR     TAKE these medications   amoxicillin-clavulanate 875-125 MG tablet Commonly known as:  AUGMENTIN Take 1 tablet by mouth every 12 (twelve) hours for 10 days.   aspirin 81 MG tablet Take 81 mg by mouth daily.   cetirizine-pseudoephedrine 5-120 MG tablet Commonly known as:  ZYRTEC-D Take 1 tablet by mouth at bedtime.   fluticasone 50 MCG/ACT nasal  spray Commonly known as:  FLONASE Place 1 spray into the nose daily.   ibuprofen 200 MG tablet Commonly known as:  ADVIL,MOTRIN Take 200-400 mg by mouth every 6 (six) hours as needed for headache or mild pain.   multivitamin with minerals Tabs tablet Take 1 tablet by mouth daily.   oxyCODONE-acetaminophen 5-325 MG tablet Commonly known as:  PERCOCET/ROXICET Take 1-2 tablets by mouth every 4 (four) hours as needed for moderate pain.   sodium chloride flush 0.9 % Soln Commonly known as:  NS Inject 10 mLs into the vein as needed (every other day).        Follow-up Information    Alphonsa Overall, MD. Go on 06/09/2017.   Specialty:  General Surgery Why:  Your appointment is at 3:00 PM, arrive by 2:30 PM. Bring photo ID and insurance information. If you need to reschedule, call the office and they should be able to assist you.  Contact information: [address] [place] [id number] [phone]          Signed: Brigid Re, [facility] 05/13/2017, 5:01 PM Pager: [phone] Consults: [phone]  Agree with above. Will get interval CT scan to evaluate drainage.  The interval appendectomy.  Will coordinate colonoscopy with Dr. Watt Climes.  Alphonsa Overall, MD, [facility] Surgery Pager: [phone] Office phone:  [phone]

## 2017-05-13 NOTE — Progress Notes (Addendum)
[facility] Progress Note     Subjective: CC: perforated appendicitis Patient with further improvement in abdominal pain. Tolerating FLD, no nausea or vomiting. Having bowel function. Discussed cytology and culture results.  UOP good. VSS.   Objective: Vital signs in last 24 hours: Temp:  [97.8 F (36.6 C)-99.1 F (37.3 C)] 98.8 F (37.1 C) (03/01 0505) Pulse Rate:  [69-79] 69 (03/01 0505) Resp:  [18] 18 (03/01 0505) BP: (135-152)/(78-88) 149/88 (03/01 0505) SpO2:  [95 %-98 %] 98 % (03/01 0505) Last BM Date: 05/12/17  Intake/Output from previous day: 02/28 0701 - 03/01 0700 In: 3045 [P.O.:480; I.V.:2400; IV Piggyback:150] Out: 0160 [Urine:3450; Drains:37] Intake/Output this shift: No intake/output data recorded.  PE: Gen: Alert, NAD, pleasant Card: Regular rate and rhythm, pedal pulses 2+ BL Pulm: Normal effort, clear to auscultation bilaterally Abd: Soft,mildlytender in RLQ, no rebound tenderness, non-distended, bowel sounds present, no HSM,drain in RLQ with small amount serosanguinous drainage Skin: warm and dry, no rashes  Psych: A&Ox3   Lab Results:  Recent Labs    05/11/17 0418 05/12/17 0944  WBC 8.5 5.9  HGB 11.6* 12.1*  HCT 35.0* 36.9*  PLT 384 386   BMET Recent Labs    05/10/17 0815 05/11/17 0418  NA 135 137  K 4.3 4.7  CL 102 104  CO2 26 27  GLUCOSE 101* 139*  BUN 18 12  CREATININE 1.04 1.02  CALCIUM 8.7* 8.6*   PT/INR Recent Labs    05/10/17 0815  LABPROT 14.3  INR 1.12   CMP     Component Value Date/Time   NA 137 05/11/2017 0418   K 4.7 05/11/2017 0418   CL 104 05/11/2017 0418   CO2 27 05/11/2017 0418   GLUCOSE 139 (H) 05/11/2017 0418   BUN 12 05/11/2017 0418   CREATININE 1.02 05/11/2017 0418   CALCIUM 8.6 (L) 05/11/2017 0418   PROT 7.5 05/09/2017 1827   ALBUMIN 3.5 05/09/2017 1827   AST 38 05/09/2017 1827   ALT 52 05/09/2017 1827   ALKPHOS 62 05/09/2017 1827   BILITOT 0.2 (L) 05/09/2017 1827   GFRNONAA  >60 05/11/2017 0418   GFRAA >60 05/11/2017 0418   Lipase     Component Value Date/Time   LIPASE 30 05/09/2017 1827       Studies/Results: No results found.  Anti-infectives: Anti-infectives (From admission, onward)   Start     Dose/Rate Route Frequency Ordered Stop   05/10/17 1600  piperacillin-tazobactam (ZOSYN) IVPB 3.375 g     3.375 g 12.5 mL/hr over 240 Minutes Intravenous Every 8 hours 05/10/17 1044     05/10/17 0800  piperacillin-tazobactam (ZOSYN) IVPB 3.375 g     3.375 g 100 mL/hr over 30 Minutes Intravenous  Once 05/10/17 0752 05/10/17 0910   05/09/17 2300  piperacillin-tazobactam (ZOSYN) IVPB 3.375 g     3.375 g 100 mL/hr over 30 Minutes Intravenous  Once 05/09/17 2252 05/10/17 0050       Assessment/Plan OSA Dilated aorta - stable HLD OA Hx of hep A  Perforated appendicitis S/P percutaneous drainage 05/10/17 - WBC 5.9 yesterday, afebrile, not tachycardic - drain with 40 cc out   - cytology shows inflammatory cells, no malignancy   - cx shows citrobacter freundii, sensitive to augmentin  - tolerating FLD - advance diet today  - colonoscopy in 01/2015 with polyps in the cecum and ascending colon - negative for malignancy  FUX:NATFTDD diet  VTE: SCDs, lovenox ID: IV zosyn 2/25>3/1; PO augmentin 3/1>>   Advance diet.  If tolerating diet, may be able to discharge later today vs tomorrow AM with drain and IR follow up. Will follow up with Dr. Lucia Gaskins in 4-5 weeks to discuss interval appendectomy.   LOS: 4 days    Brigid Re , [facility] 05/13/2017, 8:04 AM Pager: [phone] Consults: [phone]  Agree with above. Initial path report okay.  Though I cautioned family that we will need final report.  He is doing well enough to go home later this afternoon. I spoke with Dr. Watt Climes.  He recommended repeat colonoscopy prior to surgyer. Wife and daughter in the room. Note, he has a vacation to   Alphonsa Overall, MD, [facility] Surgery Pager: [phone] Office phone:  [phone]

## 2017-05-13 NOTE — Progress Notes (Signed)
Pt alert and oriented. D/C instructions on how to flush drain and record drainage was given.  Other d/c instructions and prescriptions were given.  All questions were answered.

## 2017-05-13 NOTE — Discharge Instructions (Signed)
Percutaneous Abscess Drain, Care After °This sheet gives you information about how to care for yourself after your procedure. Your health care provider may also give you more specific instructions. If you have problems or questions, contact your health care provider. °What can I expect after the procedure? °After your procedure, it is common to have: °· A small amount of bruising and discomfort in the area where the drainage tube (catheter) was placed. °· Sleepiness and fatigue. This should go away after the medicines you were given have worn off. ° °Follow these instructions at home: °Incision care °· Follow instructions from your health care provider about how to take care of your incision. Make sure you: °? Wash your hands with soap and water before you change your bandage (dressing). If soap and water are not available, use hand sanitizer. °? Change your dressing as told by your health care provider. °? Leave stitches (sutures), skin glue, or adhesive strips in place. These skin closures may need to stay in place for 2 weeks or longer. If adhesive strip edges start to loosen and curl up, you may trim the loose edges. Do not remove adhesive strips completely unless your health care provider tells you to do that. °· Check your incision area every day for signs of infection. Check for: °? More redness, swelling, or pain. °? More fluid or blood. °? Warmth. °? Pus or a bad smell. °? Fluid leaking from around your catheter (instead of fluid draining through your catheter). °Catheter care °· Follow instructions from your health care provider about emptying and cleaning your catheter and collection bag. You may need to clean the catheter every day so it does not clog. °· If directed, write down the following information every time you empty your bag: °? The date and time. °? The amount of drainage. °General instructions °· Rest at home for 1-2 days after your procedure. Return to your normal activities as told by your  health care provider. °· Do not take baths, swim, or use a hot tub for 24 hours after your procedure, or until your health care provider says that this is okay. °· Take over-the-counter and prescription medicines only as told by your health care provider. °· Keep all follow-up visits as told by your health care provider. This is important. °Contact a health care provider if: °· You have less than 10 mL of drainage a day for 2-3 days in a row, or as directed by your health care provider. °· You have more redness, swelling, or pain around your incision area. °· You have more fluid or blood coming from your incision area. °· Your incision area feels warm to the touch. °· You have pus or a bad smell coming from your incision area. °· You have fluid leaking from around your catheter (instead of through your catheter). °· You have a fever or chills. °· You have pain that does not get better with medicine. °Get help right away if: °· Your catheter comes out. °· You suddenly stop having drainage from your catheter. °· You suddenly have blood in the fluid that is draining from your catheter. °· You become dizzy or you faint. °· You develop a rash. °· You have nausea or vomiting. °· You have difficulty breathing or you feel short of breath. °· You develop chest pain. °· You have problems with your speech or vision. °· You have trouble balancing or moving your arms or legs. °Summary °· It is common to have a small   amount of bruising and discomfort in the area where the drainage tube (catheter) was placed. °· You may be directed to record the amount of drainage from the bag every time you empty it. °· Follow instructions from your health care provider about emptying and cleaning your catheter and collection bag. °This information is not intended to replace advice given to you by your health care provider. Make sure you discuss any questions you have with your health care provider. °Document Released: 07/16/2013 Document  Revised: 01/22/2016 Document Reviewed: 01/22/2016 °Elsevier Interactive Patient Education © 2017 [employer] Inc[employer] ° °

## 2017-05-16 ENCOUNTER — Other Ambulatory Visit: Payer: Self-pay | Admitting: Surgery

## 2017-05-16 ENCOUNTER — Ambulatory Visit (HOSPITAL_COMMUNITY)
Admission: RE | Admit: 2017-05-16 | Payer: No Typology Code available for payment source | Source: Ambulatory Visit | Admitting: Orthopedic Surgery

## 2017-05-16 ENCOUNTER — Encounter (HOSPITAL_COMMUNITY): Admission: RE | Payer: Self-pay | Source: Ambulatory Visit

## 2017-05-16 DIAGNOSIS — K3532 Acute appendicitis with perforation and localized peritonitis, without abscess: Secondary | ICD-10-CM

## 2017-05-16 SURGERY — ARTHROSCOPY, KNEE, WITH MEDIAL MENISCECTOMY
Anesthesia: General | Site: Knee | Laterality: Right

## 2017-05-17 LAB — AEROBIC/ANAEROBIC CULTURE W GRAM STAIN (SURGICAL/DEEP WOUND)

## 2017-05-17 LAB — AEROBIC/ANAEROBIC CULTURE (SURGICAL/DEEP WOUND): SPECIAL REQUESTS: NORMAL

## 2017-05-25 ENCOUNTER — Ambulatory Visit
Admission: RE | Admit: 2017-05-25 | Discharge: 2017-05-25 | Disposition: A | Discharge status: Home / Self Care | Payer: No Typology Code available for payment source | Source: Ambulatory Visit | Attending: Physician Assistant | Admitting: Physician Assistant

## 2017-05-25 ENCOUNTER — Encounter: Payer: Self-pay | Admitting: *Deleted

## 2017-05-25 ENCOUNTER — Ambulatory Visit
Admission: RE | Admit: 2017-05-25 | Discharge: 2017-05-25 | Disposition: A | Discharge status: Home / Self Care | Payer: No Typology Code available for payment source | Source: Ambulatory Visit | Attending: Surgery | Admitting: Surgery

## 2017-05-25 ENCOUNTER — Other Ambulatory Visit: Payer: Self-pay | Admitting: Surgery

## 2017-05-25 DIAGNOSIS — K3532 Acute appendicitis with perforation and localized peritonitis, without abscess: Secondary | ICD-10-CM

## 2017-05-25 HISTORY — PX: IR RADIOLOGIST EVAL & MGMT: IMG5224

## 2017-05-25 MED ORDER — IOPAMIDOL (ISOVUE-300) INJECTION 61%
125.0000 mL | Freq: Once | INTRAVENOUS | Status: AC | PRN
  Administered 2017-05-25: 125 mL via INTRAVENOUS

## 2017-05-25 NOTE — Progress Notes (Signed)
Referring Physician(s): Dr Nydia Bouton  Chief Complaint: The patient is seen in follow up today s/p 05/10/17: Successful CT guided placement of a 12 French all purpose drain catheter into the right lower quadrant periappendiceal fluid collection with aspiration of 80 cc of viscous, slightly blood tinged non foul smelling fluid.  History of present illness:  Doing well OP is 10- 15 cc daily 5cc flushes daily Denies fever; chills Denies pain Eating well Back to work with limited duties Antibiotics finished 05/23/17 To see Dr Keturah Barre Lucia Gaskins 06/09/17 Scheduled for CT and poss injection today  Past Medical History:  Diagnosis Date  . Aorta disorder ([place])    dilated, followed by MRI's   . Aortic root dilation ([facility])   . Arthritis    OA- R knee, hands   . ED (erectile dysfunction)   . 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 High cholesterol   . OSA (obstructive sleep apnea) 04/15/2015   Severe OSA with AHI 35.2 and successful CPAP titration to 9cmH2O, needs CPAP, will continue to explore   . Seasonal allergies     Past Surgical History:  Procedure Laterality Date  . CERVICAL FUSION  1999  . COLONOSCOPY WITH PROPOFOL  04/19/2012   Procedure: COLONOSCOPY WITH PROPOFOL;  Surgeon: Jeryl Columbia, MD;  Location: [facility] ENDOSCOPY;  Service: Endoscopy;  Laterality: N/A;  . COLONOSCOPY WITH PROPOFOL N/A 01/17/2015   Procedure: COLONOSCOPY WITH PROPOFOL;  Surgeon: Clarene Essex, MD;  Location: [facility] ENDOSCOPY;  Service: Endoscopy;  Laterality: N/A;  . HOT HEMOSTASIS  04/19/2012   Procedure: HOT HEMOSTASIS (ARGON PLASMA COAGULATION/BICAP);  Surgeon: Jeryl Columbia, MD;  Location: [facility];  Service: Endoscopy;  Laterality: N/A;  . HOT HEMOSTASIS N/A 01/17/2015   Procedure: HOT HEMOSTASIS (ARGON PLASMA COAGULATION/BICAP);  Surgeon: Clarene Essex, MD;  Location: [facility];  Service: Endoscopy;  Laterality: N/A;  . NASAL SEPTUM SURGERY  1998    Allergies: Patient has no known allergies.  Medications: Prior to  Admission medications   Medication Sig Start Date End Date Taking? Authorizing Provider  aspirin 81 MG tablet Take 81 mg by mouth daily.   Yes [provider]  cetirizine-pseudoephedrine (ZYRTEC-D) 5-120 MG per tablet Take 1 tablet by mouth at bedtime.   Yes [provider]  fluticasone (FLONASE) 50 MCG/ACT nasal spray Place 1 spray into the nose daily.   Yes [provider]  ibuprofen (ADVIL,MOTRIN) 200 MG tablet Take 200-400 mg by mouth every 6 (six) hours as needed for headache or mild pain.    Yes [provider]  Multiple Vitamin (MULTIVITAMIN WITH MINERALS) TABS Take 1 tablet by mouth daily.   Yes [provider]  sodium chloride flush (NS) 0.9 % SOLN Inject 10 mLs into the vein as needed (every other day). 05/13/17  Yes Rayburn, Floyce Stakes, PA-C  oxyCODONE-acetaminophen (PERCOCET/ROXICET) 5-325 MG tablet Take 1-2 tablets by mouth every 4 (four) hours as needed for moderate pain. 05/13/17   Rayburn, Floyce Stakes, PA-C     Family History  Problem Relation Age of Onset  . Heart attack Mother   . Hyperlipidemia Mother   . Heart attack Father   . Hyperlipidemia Father   . Hypertension Sister   . Heart attack Brother   . Heart disease Brother   . Heart attack Brother   . Heart disease Brother     Social History   Socioeconomic History  . Marital status: Married    Spouse name: Not on file  . Number  of children: Not on file  . Years of education: Not on file  . Highest education level: Not on file  Social Needs  . Financial resource strain: Not on file  . Food insecurity - worry: Not on file  . Food insecurity - inability: Not on file  . Transportation needs - medical: Not on file  . Transportation needs - non-medical: Not on file  Occupational History  . Not on file  Tobacco Use  . Smoking status: Former Smoker    Packs/day: 1.00    Years: 3.00    Pack years: 3.00    Last attempt to quit: 07/14/1987    Years since quitting: 29.8  .  Smokeless tobacco: Never Used  Substance and Sexual Activity  . Alcohol use: Yes    Comment: rare  . Drug use: No  . Sexual activity: Not on file  Other Topics Concern  . Not on file  Social History Narrative  . Not on file     Vital Signs: BP (!) 145/80   Pulse 77   Temp 97.6 F (36.4 C) (Oral)   Resp 15   SpO2 99%   Physical Exam  Constitutional: He is oriented to person, place, and time.  Musculoskeletal: Normal range of motion.  Neurological: He is alert and oriented to person, place, and time.  Skin: Skin is warm and dry.  Site is clean and dry NT   Psychiatric: He has a normal mood and affect. His behavior is normal.  Nursing note and vitals reviewed.  CT has been reviewed by Dr Barbie Banner: IMPRESSION: After drainage, the periappendiceal abscess has reduced in size. A small cavity persists. The drain remains appropriately positioned. Inflammatory changes of the appendix have improved. No new abscess.  Imaging: No results found.  Labs:  CBC: Recent Labs    05/06/17 1514 05/10/17 0815 05/11/17 0418 05/12/17 0944  WBC 11.1* 8.5 8.5 5.9  HGB 12.4* 11.6* 11.6* 12.1*  HCT 37.7* 35.0* 35.0* 36.9*  PLT 305 326 384 386    COAGS: Recent Labs    05/06/17 1514 05/10/17 0815  INR 1.09 1.12  APTT 35  --     BMP: Recent Labs    05/06/17 1514 05/09/17 1827 05/10/17 0815 05/11/17 0418  NA 139 139 135 137  K 4.0 4.0 4.3 4.7  CL 102 103 102 104  CO2 24 26 26 27   GLUCOSE 108* 112* 101* 139*  BUN 18 23* 18 12  CALCIUM 8.9 9.0 8.7* 8.6*  CREATININE 0.97 1.13 1.04 1.02  GFRNONAA >60 >60 >60 >60  GFRAA >60 >60 >60 >60    LIVER FUNCTION TESTS: Recent Labs    05/06/17 1514 05/09/17 1827  BILITOT 0.3 0.2*  AST 32 38  ALT 37 52  ALKPHOS 64 62  PROT 7.4 7.5  ALBUMIN 3.4* 3.5    Assessment:  Pt and wife aware of CT findings Drain will remain for now per Dr Barbie Banner Follow up in 2 weeks Continue flushes 5 cc 1 x/day Scheduled for 3/27 - CT and  possible drain injection/possible removal. Pt is agreeable  Signed: Vincenta Steffey A, PA-C 05/25/2017, 1:23 PM   Please refer to Dr. Barbie Banner attestation of this note for management and plan.

## 2017-06-08 ENCOUNTER — Encounter: Payer: Self-pay | Admitting: *Deleted

## 2017-06-08 ENCOUNTER — Ambulatory Visit
Admission: RE | Admit: 2017-06-08 | Discharge: 2017-06-08 | Disposition: A | Discharge status: Home / Self Care | Payer: No Typology Code available for payment source | Source: Ambulatory Visit | Attending: Surgery | Admitting: Surgery

## 2017-06-08 DIAGNOSIS — K3532 Acute appendicitis with perforation and localized peritonitis, without abscess: Secondary | ICD-10-CM

## 2017-06-08 HISTORY — PX: IR RADIOLOGIST EVAL & MGMT: IMG5224

## 2017-06-08 MED ORDER — IOPAMIDOL (ISOVUE-300) INJECTION 61%
125.0000 mL | Freq: Once | INTRAVENOUS | Status: AC | PRN
  Administered 2017-06-08: 125 mL via INTRAVENOUS

## 2017-06-08 NOTE — Progress Notes (Signed)
Chief Complaint: Follow-up status post percutaneous catheter drainage of appendiceal abscess on 05/10/2017.  History of Present Illness: Daniel Humphrey is a 62 y.o. male with a recent history of ruptured appendicitis and appendiceal abscess who is status post percutaneous catheter drainage of the abscess on 05/10/2017.  CT on 05/25/2017 demonstrated a small residual amount of fluid surrounding the distal catheter.  Since that time, output from the drain has continued to diminish with minimal volume of drainage above the 5 mL of flushing performed once per day.  He continues to be afebrile with no abdominal pain.  Diet and bowel movements have been normal.  He has a surgical follow-up appointment with Dr. Lucia Gaskins tomorrow.  Past Medical History:  Diagnosis Date  . Aorta disorder ([place])    dilated, followed by MRI's   . Aortic root dilation ([facility])   . Arthritis    OA- R knee, hands   . ED (erectile dysfunction)   . 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 High cholesterol   . OSA (obstructive sleep apnea) 04/15/2015   Severe OSA with AHI 35.2 and successful CPAP titration to 9cmH2O, needs CPAP, will continue to explore   . Seasonal allergies     Past Surgical History:  Procedure Laterality Date  . CERVICAL FUSION  1999  . COLONOSCOPY WITH PROPOFOL  04/19/2012   Procedure: COLONOSCOPY WITH PROPOFOL;  Surgeon: Jeryl Columbia, MD;  Location: [facility];  Service: Endoscopy;  Laterality: N/A;  . COLONOSCOPY WITH PROPOFOL N/A 01/17/2015   Procedure: COLONOSCOPY WITH PROPOFOL;  Surgeon: Clarene Essex, MD;  Location: [facility];  Service: Endoscopy;  Laterality: N/A;  . HOT HEMOSTASIS  04/19/2012   Procedure: HOT HEMOSTASIS (ARGON PLASMA COAGULATION/BICAP);  Surgeon: Jeryl Columbia, MD;  Location: [facility];  Service: Endoscopy;  Laterality: N/A;  . HOT HEMOSTASIS N/A 01/17/2015   Procedure: HOT HEMOSTASIS (ARGON PLASMA COAGULATION/BICAP);  Surgeon: Clarene Essex, MD;  Location: [facility];  Service:  Endoscopy;  Laterality: N/A;  . IR RADIOLOGIST EVAL & MGMT  05/25/2017  . IR RADIOLOGIST EVAL & MGMT  06/08/2017  . NASAL SEPTUM SURGERY  1998    Allergies: Patient has no known allergies.  Medications: Prior to Admission medications   Medication Sig Start Date End Date Taking? Authorizing Provider  aspirin 81 MG tablet Take 81 mg by mouth daily.   Yes [provider]  cetirizine-pseudoephedrine (ZYRTEC-D) 5-120 MG per tablet Take 1 tablet by mouth at bedtime.   Yes [provider]  fluticasone (FLONASE) 50 MCG/ACT nasal spray Place 1 spray into the nose daily.   Yes [provider]  ibuprofen (ADVIL,MOTRIN) 200 MG tablet Take 200-400 mg by mouth every 6 (six) hours as needed for headache or mild pain.    Yes [provider]  Multiple Vitamin (MULTIVITAMIN WITH MINERALS) TABS Take 1 tablet by mouth daily.   Yes [provider]  sodium chloride flush (NS) 0.9 % SOLN Inject 10 mLs into the vein as needed (every other day). 05/13/17  Yes Rayburn, Floyce Stakes, PA-C  oxyCODONE-acetaminophen (PERCOCET/ROXICET) 5-325 MG tablet Take 1-2 tablets by mouth every 4 (four) hours as needed for moderate pain. Patient not taking: Reported on 06/08/2017 05/13/17   Rayburn, Floyce Stakes, PA-C     Family History  Problem Relation Age of Onset  . Heart attack Mother   . Hyperlipidemia Mother   . Heart attack Father   . Hyperlipidemia Father   . Hypertension Sister   . Heart  attack Brother   . Heart disease Brother   . Heart attack Brother   . Heart disease Brother     Social History   Socioeconomic History  . Marital status: Married    Spouse name: Not on file  . Number of children: Not on file  . Years of education: Not on file  . Highest education level: Not on file  Occupational History  . Not on file  Social Needs  . Financial resource strain: Not on file  . Food insecurity:    Worry: Not on file    Inability: Not on file  . Transportation needs:     Medical: Not on file    Non-medical: Not on file  Tobacco Use  . Smoking status: Former Smoker    Packs/day: 1.00    Years: 3.00    Pack years: 3.00    Last attempt to quit: 07/14/1987    Years since quitting: 29.9  . Smokeless tobacco: Never Used  Substance and Sexual Activity  . Alcohol use: Yes    Comment: rare  . Drug use: No  . Sexual activity: Not on file  Lifestyle  . Physical activity:    Days per week: Not on file    Minutes per session: Not on file  . Stress: Not on file  Relationships  . Social connections:    Talks on phone: Not on file    Gets together: Not on file    Attends religious service: Not on file    Active member of club or organization: Not on file    Attends meetings of clubs or organizations: Not on file    Relationship status: Not on file  Other Topics Concern  . Not on file  Social History Narrative  . Not on file    Review of Systems: A 12 point ROS discussed and pertinent positives are indicated in the HPI above.  All other systems are negative.  Review of Systems  Constitutional: Negative.   Respiratory: Negative.   Cardiovascular: Negative.   Gastrointestinal: Negative.   Genitourinary: Negative.   Musculoskeletal: Negative.   Neurological: Negative.     Vital Signs: BP (!) 142/77   Pulse 76   Temp 97.9 F (36.6 C)   Resp 14   Ht 5\' 10"  (1.778 m)   Wt 230 lb (104.3 kg)   SpO2 100%   BMI 33.00 kg/m   Physical Exam  Constitutional: He is oriented to person, place, and time. He appears well-developed and well-nourished. No distress.  Abdominal: Soft. He exhibits no distension. There is no tenderness. There is no rebound and no guarding.  Neurological: He is alert and oriented to person, place, and time.  Skin: Skin is warm and dry. He is not diaphoretic.  Mild erythema around RLQ drain exit site without fluctuance or drainage around catheter.  Vitals reviewed.   Imaging: Ct Abdomen Pelvis W Contrast  Result Date:  06/08/2017 CLINICAL DATA:  Ruptured appendicitis and status post percutaneous drainage of appendiceal abscess on 05/10/2017. EXAM: CT ABDOMEN AND PELVIS WITH CONTRAST TECHNIQUE: Multidetector CT imaging of the abdomen and pelvis was performed using the standard protocol following bolus administration of intravenous contrast. CONTRAST:  192mL ISOVUE-300 IOPAMIDOL (ISOVUE-300) INJECTION 61% COMPARISON:  05/25/2016 FINDINGS: Lower chest: No acute abnormality. Hepatobiliary: No focal liver abnormality is seen. No gallstones, gallbladder wall thickening, or biliary dilatation. Pancreas: Unremarkable. No pancreatic ductal dilatation or surrounding inflammatory changes. Spleen: Normal in size without focal abnormality. Adrenals/Urinary Tract: Adrenal glands  are unremarkable. Kidneys are normal, without renal calculi, focal lesion, or hydronephrosis. Bladder is unremarkable. Stomach/Bowel: No evidence of bowel obstruction or ileus. No free air identified. There is further decrease in inflammation involving the appendix with maximal appendix diameter of approximately 9 mm proximal to the indwelling percutaneous drainage catheter. At the level of the drain, no further liquefied abscess is identified. Vascular/Lymphatic: No significant vascular findings are present. No enlarged abdominal or pelvic lymph nodes. Reproductive: Prostate is unremarkable. Other: Stable small left inguinal hernia contains fat. No abdominopelvic ascites. Musculoskeletal: No acute or significant osseous findings. IMPRESSION: Resolution of appendiceal abscess after further percutaneous catheter drainage. Decreased appendiceal inflammation with maximal appendix diameter of approximately 9 mm proximal to the drainage catheter. The percutaneous drain was removed following the CT study. Please see separately dictated Epic clinic note. Electronically Signed   By: Aletta Edouard M.D.   On: 06/08/2017 11:40   Ct Abdomen Pelvis W Contrast  Result Date:  05/25/2017 CLINICAL DATA:  Follow-up appendiceal abscess drain. EXAM: CT ABDOMEN AND PELVIS WITH CONTRAST TECHNIQUE: Multidetector CT imaging of the abdomen and pelvis was performed using the standard protocol following bolus administration of intravenous contrast. CONTRAST:  174mL ISOVUE-300 IOPAMIDOL (ISOVUE-300) INJECTION 61% COMPARISON:  05/09/2017 FINDINGS: Lower chest: No acute abnormality. Hepatobiliary: Diffuse hepatic steatosis.  Normal gallbladder. Pancreas: Unremarkable Spleen: Unremarkable Adrenals/Urinary Tract: Adrenal glands are unremarkable. Kidneys are unremarkable. Bladder is unremarkable. Stomach/Bowel: Stomach and duodenum are within normal limits. No obvious mass in the colon. No evidence of small-bowel obstruction. The appendix remains somewhat prominent with haziness of the wall. Overall, the appearance of the appendix has markedly improved with less inflammation. The periappendiceal abscess is smaller measuring 2.5 x 3.4 cm compared with 5.7 x 4.8 cm on the prior study. A drain remains coiled within the abscess cavity. Vascular/Lymphatic: No abnormal retroperitoneal adenopathy. Atherosclerotic calcifications are noted in the aorta. No evidence of aortic aneurysm. Reproductive: Normal prostate. Other: No free fluid in the pelvis. No new abscess. Small left inguinal hernia contains adipose. Musculoskeletal: No vertebral compression deformity. IMPRESSION: After drainage, the periappendiceal abscess has reduced in size. A small cavity persists. The drain remains appropriately positioned. Inflammatory changes of the appendix have improved. No new abscess. Electronically Signed   By: Marybelle Killings M.D.   On: 05/25/2017 13:21   Ct Abdomen Pelvis W Contrast  Result Date: 05/09/2017 CLINICAL DATA:  61 year old male with right lower quadrant abdominal pain. EXAM: CT ABDOMEN AND PELVIS WITH CONTRAST TECHNIQUE: Multidetector CT imaging of the abdomen and pelvis was performed using the standard  protocol following bolus administration of intravenous contrast. CONTRAST:  122mL ISOVUE-300 IOPAMIDOL (ISOVUE-300) INJECTION 61% COMPARISON:  None. FINDINGS: Lower chest: The visualized lung bases are clear. No intra-abdominal free air or free fluid. Hepatobiliary: No focal liver abnormality is seen. No gallstones, gallbladder wall thickening, or biliary dilatation. Pancreas: Unremarkable. No pancreatic ductal dilatation or surrounding inflammatory changes. Spleen: Normal in size without focal abnormality. Adrenals/Urinary Tract: Adrenal glands are unremarkable. Kidneys are normal, without renal calculi, focal lesion, or hydronephrosis. Bladder is unremarkable. Stomach/Bowel: There is no bowel obstruction. There is inflammatory changes and thickening of the appendix extending to the base of the cecum. There is a 5.7 x 4.8 x 6.5 cm fluid collection in the tip of the appendix which may represent an abscess or a loculated perforation. The appendix is located the right lower quadrant inferior to the cecum and extending into the right hemipelvis. The appendix measures approximately 2 cm in thickness. Vascular/Lymphatic: Mild  aortoiliac atherosclerotic disease. The IVC appears unremarkable. No portal venous gas. There is no adenopathy. Reproductive: The prostate and seminal vesicles are grossly unremarkable. Other: None Musculoskeletal: Mild degenerative changes of the spine. No acute osseous pathology. Probable L3 hemangioma. IMPRESSION: Acute appendicitis with appendiceal abscess or loculated perforation. These results were called by telephone at the time of interpretation on 05/09/2017 at 10:48 pm to Dr. Sherwood Gambler , who verbally acknowledged these results. Electronically Signed   By: Anner Crete M.D.   On: 05/09/2017 22:49   Ct Image Guided Drainage By Percutaneous Catheter  Result Date: 05/10/2017 INDICATION: Right lower quadrant abdominal pain, concern for ruptured appendicitis. Please perform CT-guided  percutaneous drainage catheter placement for infection source control purposes. EXAM: CT IMAGE GUIDED DRAINAGE BY PERCUTANEOUS CATHETER COMPARISON:  CT abdomen pelvis - 05/09/2017 MEDICATIONS: The patient is currently admitted to the hospital and receiving intravenous antibiotics. The antibiotics were administered within an appropriate time frame prior to the initiation of the procedure. ANESTHESIA/SEDATION: Moderate (conscious) sedation was employed during this procedure. A total of Versed 2 mg and Fentanyl 100 mcg was administered intravenously. Moderate Sedation Time: 23 minutes. The patient's level of consciousness and vital signs were monitored continuously by radiology nursing throughout the procedure under my direct supervision. CONTRAST:  None COMPLICATIONS: None immediate. PROCEDURE: Informed written consent was obtained from the patient after a discussion of the risks, benefits and alternatives to treatment. The patient was placed supine on the CT gantry and a pre procedural CT was performed re-demonstrating the known abscess/fluid collection within the right lower abdominal quadrant with dominant component measuring approximately 6.0 x 5.0 cm (image 17, series 2). The procedure was planned. A timeout was performed prior to the initiation of the procedure. The skin overlying the ventral aspect the right lower abdomen was prepped and draped in the usual sterile fashion. The overlying soft tissues were anesthetized with 1% lidocaine with epinephrine. Appropriate trajectory was planned with the use of a 22 gauge spinal needle. An 18 gauge trocar needle was advanced into the abscess/fluid collection and a short Amplatz super stiff wire was coiled within the collection. Appropriate positioning was confirmed with a limited CT scan. The tract was serially dilated allowing placement of a 12 [place] all-purpose drainage catheter. Appropriate positioning was confirmed with a limited postprocedural CT scan. Despite  appropriate positioning of the percutaneous drainage catheter, very little amount of fluid was able to be aspirated from collection. As such, approximately 10 cc of saline was utilized to irrigate the collection ultimately resulting in the evacuation of approximately 80 cc of viscous slightly blood tinged non foul smelling fluid. All fluid was capped and sent to the laboratory for Gram stain and cytologic analysis. The tube was connected to a JP bulb and sutured in place. A dressing was placed. The patient tolerated the procedure well without immediate post procedural complication. IMPRESSION: Successful CT guided placement of a 55 French all purpose drain catheter into the right lower quadrant periappendiceal fluid collection with aspiration of 80 cc of viscous, slightly blood tinged non foul smelling fluid. Samples were sent to the laboratory for both Gram stain and cytologic analysis, as the viscous contents of this collection could be seen in the setting of a mucocele. Electronically Signed   By: Sandi Mariscal M.D.   On: 05/10/2017 17:43   Ir Radiologist Eval & Mgmt  Result Date: 06/08/2017 Please refer to notes tab for details about interventional procedure. (Op Note)  Ir Radiologist Eval & Mgmt  Result  Date: 05/25/2017 Please refer to notes tab for details about interventional procedure. (Op Note)   Labs:  CBC: Recent Labs    05/06/17 1514 05/10/17 0815 05/11/17 0418 05/12/17 0944  WBC 11.1* 8.5 8.5 5.9  HGB 12.4* 11.6* 11.6* 12.1*  HCT 37.7* 35.0* 35.0* 36.9*  PLT 305 326 384 386    COAGS: Recent Labs    05/06/17 1514 05/10/17 0815  INR 1.09 1.12  APTT 35  --     BMP: Recent Labs    05/06/17 1514 05/09/17 1827 05/10/17 0815 05/11/17 0418  NA 139 139 135 137  K 4.0 4.0 4.3 4.7  CL 102 103 102 104  CO2 24 26 26 27   GLUCOSE 108* 112* 101* 139*  BUN 18 23* 18 12  CALCIUM 8.9 9.0 8.7* 8.6*  CREATININE 0.97 1.13 1.04 1.02  GFRNONAA >60 >60 >60 >60  GFRAA >60 >60 >60  >60    LIVER FUNCTION TESTS: Recent Labs    05/06/17 1514 05/09/17 1827  BILITOT 0.3 0.2*  AST 32 38  ALT 37 52  ALKPHOS 64 62  PROT 7.4 7.5  ALBUMIN 3.4* 3.5    Assessment and Plan:  The follow-up CT shows continued improvement with resolution of the appendiceal abscess and further decrease in inflammation of the appendix.  Given the appearance of CT and lack of significant output from the drain, the drain was removed today in its entirety without difficulty.  A dressing was applied at the drain exit site.  Daniel Humphrey will follow up with Dr. Lucia Gaskins tomorrow.   Electronically SignedAletta Edouard T 06/08/2017, 12:38 PM     I spent a total of 15 Minutes in face to face in clinical consultation, greater than 50% of which was counseling/coordinating care for management of an appendiceal abscess drain.

## 2017-06-19 ENCOUNTER — Ambulatory Visit: Payer: Self-pay | Admitting: Surgery

## 2017-07-20 NOTE — Patient Instructions (Addendum)
[facility]  07/20/2017   Your procedure is scheduled on: 07-29-17   Report to [facility] Main  Entrance    Report to admitting at 5:30 AM    Call this number if you have problems the morning of surgery [phone]   Remember: DRINK 2 PRESURGERY ENSURE DRINKS THE NIGHT BEFORE SURGERY AT  1000 PM AND 1 PRESURGERY DRINK THE DAY OF THE PROCEDURE 3 HOURS PRIOR TO SCHEDULED SURGERY. NO SOLIDS AFTER MIDNIGHT THE DAY PRIOR TO THE SURGERY. NOTHING BY MOUTH EXCEPT CLEAR LIQUIDS UNTIL THREE HOURS PRIOR TO SCHEDULED SURGERY. PLEASE FINISH PRESURGERY ENSURE DRINK PER SURGEON ORDER 3 HOURS PRIOR TO SCHEDULED SURGERY TIME WHICH NEEDS TO BE COMPLETED AT 4:30 AM      CLEAR LIQUID DIET   Foods Allowed                                                                     Foods Excluded  Coffee and tea, regular and decaf                             liquids that you cannot  Plain Jell-O in any flavor                                             see through such as: Fruit ices (not with fruit pulp)                                     milk, soups, orange juice  Iced Popsicles                                    All solid food Carbonated beverages, regular and diet                                    Cranberry, grape and apple juices Sports drinks like Gatorade Lightly seasoned clear broth or consume(fat free) Sugar, honey syrup  Sample Menu Breakfast                                Lunch                                     Supper Cranberry juice                    Beef broth                            Chicken broth Jell-O  Grape juice                           Apple juice Coffee or tea                        Jell-O                                      Popsicle                                                Coffee or tea                        Coffee or tea  _____________________________________________________________________     Take  these medicines the morning of surgery with A SIP OF WATER: None. You may bring and use your nasal spray as needed.                                You may not have any metal on your body including hair pins and              piercings  Do not wear jewelry, lotions, powders or deodorant             Men may shave face and neck.   Do not bring valuables to the hospital. [place].  Contacts, dentures or bridgework may not be worn into surgery.  Leave suitcase in the car. After surgery it may be brought to your room.     Special Instructions: Please consume a Clear Liquid Diet the Day of Prep              Please read over the following fact sheets you were given: _____________________________________________________________________           [facility] - Preparing for Surgery Before surgery, you can play an important role.  Because skin is not sterile, your skin needs to be as free of germs as possible.  You can reduce the number of germs on your skin by washing with CHG (chlorahexidine gluconate) soap before surgery.  CHG is an antiseptic cleaner which kills germs and bonds with the skin to continue killing germs even after washing. Please DO NOT use if you have an allergy to CHG or antibacterial soaps.  If your skin becomes reddened/irritated stop using the CHG and inform your nurse when you arrive at Short Stay. Do not shave (including legs and underarms) for at least 48 hours prior to the first CHG shower.  You may shave your face/neck. Please follow these instructions carefully:  1.  Shower with CHG Soap the night before surgery and the  morning of Surgery.  2.  If you choose to wash your hair, wash your hair first as usual with your  normal  shampoo.  3.  After you shampoo, rinse your hair and body thoroughly to remove the  shampoo.  4.  Use CHG as you would any other liquid soap.  You can apply chg directly  to the  skin and wash                       Gently with a scrungie or clean washcloth.  5.  Apply the CHG Soap to your body ONLY FROM THE NECK DOWN.   Do not use on face/ open                           Wound or open sores. Avoid contact with eyes, ears mouth and genitals (private parts).                       Wash face,  Genitals (private parts) with your normal soap.             6.  Wash thoroughly, paying special attention to the area where your surgery  will be performed.  7.  Thoroughly rinse your body with warm water from the neck down.  8.  DO NOT shower/wash with your normal soap after using and rinsing off  the CHG Soap.                9.  Pat yourself dry with a clean towel.            10.  Wear clean pajamas.            11.  Place clean sheets on your bed the night of your first shower and do not  sleep with pets. Day of Surgery : Do not apply any lotions/deodorants the morning of surgery.  Please wear clean clothes to the hospital/surgery center.  FAILURE TO FOLLOW THESE INSTRUCTIONS MAY RESULT IN THE CANCELLATION OF YOUR SURGERY PATIENT SIGNATURE_________________________________  NURSE SIGNATURE__________________________________  ________________________________________________________________________  WHAT IS A BLOOD TRANSFUSION? Blood Transfusion Information  A transfusion is the replacement of blood or some of its parts. Blood is made up of multiple cells which provide different functions.  Red blood cells carry oxygen and are used for blood loss replacement.  White blood cells fight against infection.  Platelets control bleeding.  Plasma helps clot blood.  Other blood products are available for specialized needs, such as hemophilia or other clotting disorders. BEFORE THE TRANSFUSION  Who gives blood for transfusions?   Healthy volunteers who are fully evaluated to make sure their blood is safe. This is blood bank blood. Transfusion therapy is the safest it has ever been  in the practice of medicine. Before blood is taken from a donor, a complete history is taken to make sure that person has no history of diseases nor engages in risky social behavior (examples are intravenous drug use or sexual activity with multiple partners). The donor's travel history is screened to minimize risk of transmitting infections, such as malaria. The donated blood is tested for signs of infectious diseases, such as HIV and hepatitis. The blood is then tested to be sure it is compatible with you in order to minimize the chance of a transfusion reaction. If you or a relative donates blood, this is often done in anticipation of surgery and is not appropriate for emergency situations. It takes many days to process the donated blood. RISKS AND COMPLICATIONS Although transfusion therapy is very safe and saves many lives, the main dangers of transfusion include:   Getting an infectious disease.  Developing a transfusion reaction.  This is an allergic reaction to something in the blood you were given. Every precaution is taken to prevent this. The decision to have a blood transfusion has been considered carefully by your caregiver before blood is given. Blood is not given unless the benefits outweigh the risks. AFTER THE TRANSFUSION  Right after receiving a blood transfusion, you will usually feel much better and more energetic. This is especially true if your red blood cells have gotten low (anemic). The transfusion raises the level of the red blood cells which carry oxygen, and this usually causes an energy increase.  The nurse administering the transfusion will monitor you carefully for complications. HOME CARE INSTRUCTIONS  No special instructions are needed after a transfusion. You may find your energy is better. Speak with your caregiver about any limitations on activity for underlying diseases you may have. SEEK MEDICAL CARE IF:   Your condition is not improving after your  transfusion.  You develop redness or irritation at the intravenous (IV) site. SEEK IMMEDIATE MEDICAL CARE IF:  Any of the following symptoms occur over the next 12 hours:  Shaking chills.  You have a temperature by mouth above 102 F (38.9 C), not controlled by medicine.  Chest, back, or muscle pain.  People around you feel you are not acting correctly or are confused.  Shortness of breath or difficulty breathing.  Dizziness and fainting.  You get a rash or develop hives.  You have a decrease in urine output.  Your urine turns a dark color or changes to pink, red, or brown. Any of the following symptoms occur over the next 10 days:  You have a temperature by mouth above 102 F (38.9 C), not controlled by medicine.  Shortness of breath.  Weakness after normal activity.  The white part of the eye turns yellow (jaundice).  You have a decrease in the amount of urine or are urinating less often.  Your urine turns a dark color or changes to pink, red, or brown. Document Released: 02/27/2000 Document Revised: 05/24/2011 Document Reviewed: 10/16/2007 [facility] Patient Information 2014 [place], [place].  _______________________________________________________________________

## 2017-07-20 NOTE — Progress Notes (Signed)
04-26-17 (Epic) EKG and LOV w/ Cardiology-Dr. Irish Lack

## 2017-07-25 ENCOUNTER — Other Ambulatory Visit: Payer: Self-pay

## 2017-07-25 ENCOUNTER — Encounter (HOSPITAL_COMMUNITY): Payer: Self-pay

## 2017-07-25 ENCOUNTER — Encounter (HOSPITAL_COMMUNITY)
Admission: RE | Admit: 2017-07-25 | Discharge: 2017-07-25 | Disposition: A | Discharge status: Home / Self Care | Payer: No Typology Code available for payment source | Source: Ambulatory Visit | Attending: Surgery | Admitting: Surgery

## 2017-07-25 DIAGNOSIS — Z01812 Encounter for preprocedural laboratory examination: Secondary | ICD-10-CM | POA: Diagnosis present

## 2017-07-25 DIAGNOSIS — Z01818 Encounter for other preprocedural examination: Secondary | ICD-10-CM | POA: Insufficient documentation

## 2017-07-25 LAB — BASIC METABOLIC PANEL
Anion gap: 9 (ref 5–15)
BUN: 20 mg/dL (ref 6–20)
CHLORIDE: 108 mmol/L (ref 101–111)
CO2: 26 mmol/L (ref 22–32)
Calcium: 9.5 mg/dL (ref 8.9–10.3)
Creatinine, Ser: 0.85 mg/dL (ref 0.61–1.24)
GFR calc Af Amer: >60 mL/min (ref >60)
GFR calc non Af Amer: >60 mL/min (ref >60)
Glucose, Bld: 104 mg/dL — ABNORMAL HIGH (ref 65–99)
POTASSIUM: 4.9 mmol/L (ref 3.5–5.1)
SODIUM: 143 mmol/L (ref 135–145)

## 2017-07-25 LAB — CBC
HEMATOCRIT: 40.8 % (ref 39.0–52.0)
Hemoglobin: 13.6 g/dL (ref 13.0–17.0)
MCH: 31 pg (ref 26.0–34.0)
MCHC: 33.3 g/dL (ref 30.0–36.0)
MCV: 92.9 fL (ref 78.0–100.0)
Platelets: 215 10*3/uL (ref 150–400)
RBC: 4.39 MIL/uL (ref 4.22–5.81)
RDW: 13.7 % (ref 11.5–15.5)
WBC: 4.9 10*3/uL (ref 4.0–10.5)

## 2017-07-25 LAB — HEMOGLOBIN A1C
Hgb A1c MFr Bld: 5.9 % — ABNORMAL HIGH (ref 4.8–5.6)
Mean Plasma Glucose: 122.63 mg/dL

## 2017-07-25 LAB — ABO/RH: ABO/RH(D): A POS

## 2017-07-28 NOTE — H&P (Signed)
Daniel "Daniel Humphrey" Daniel Humphrey  Location: [facility] Patient #: [id number] DOB: Feb 18, 1956 Married / Language: English / Race: White Male  History of Present Illness   The patient is a 62 year old male who presents with a complaint of appendiceal mass.  Goes by "Daniel Humphrey"  The PCP is Dr. Shirline Frees  His GI is Dr Cleaster Corin  He comes here with his wife.  He underwent a percutaneous drainage of an appendiceal abscess on 10 May 2017. There was concern about this being more than just an abscess, i.e. an appendiceal tumor. He had a CT scan on 06/08/2017 which showed Resolution of appendiceal abscess after further percutaneous catheter drainage. Decreased appendiceal inflammation with maximal appendix diameter of approximately 9 mm proximal to the drainage catheter. The percutaneous drain was removed following the CT study. He is still sore in the RLQ where the drain was - but this is getting better. His appetite is good and his bowels are working okay.  We talked about timing. He is going to [place] at the end of April. His wife is having esophageal surgery in [facility] on 5/10. We are lookind at doing the lap appendectomy after his wife's surgery. I will get Dr. Watt Climes to do his colonosocpy the day before the surgery. That way, he will not need another bowel prep. Looking at the CT scan, it is very unlikely that I will need to do more than an appendectomy anyway. I did go over with his he and his wife stayning on clear liquids after the colonoscopy. I reviewed the surgery risks including: infection, bleeding, needing to do open surgery, possible colon resection, and the possibility of having to do more surgery, depending on the pathology.  Past Medical History: 1. OSA 2. Dilated arota - stable 3. History of hep A. 4. For arthroscopic right knee surger by Dr. Jarvis Newcomer (delayed) His knee is bothering him enough that they have rented a  scooter for [place] History: Married. Wife, Abigail Butts (she had an esophageal study in [facility] about the same time he came in with his appendix issues) Has a daughter, Nira Conn.  He works at Museum/gallery curator at [employer]  Past Surgical History (April Staton, [place]; 06/09/2017 3:42 PM) Colon Polyp Removal - Colonoscopy  Colon Polyp Removal - Open  Spinal Surgery - Neck   Diagnostic Studies History (April Staton, CMA; 06/09/2017 3:42 PM) Colonoscopy  1-5 years ago  Allergies Sabino Gasser; 06/09/2017 3:10 PM) No Known Drug Allergies [06/09/2017]: Allergies Reconciled   Medication History Sabino Gasser; 06/09/2017 3:12 PM) Amoxicillin-Pot Clavulanate (875-125MG  Tablet, Oral) Active. Fluticasone Propionate (50MCG/ACT Suspension, Nasal) Active. Aspir-Low (81MG  Tablet DR, 1 Oral daily) Active. Cetirizine HCl (5MG  Tablet Chewable, Oral) Active. Medications Reconciled  Social History (April Staton, CMA; 06/09/2017 3:42 PM) Alcohol use  Occasional alcohol use. Caffeine use  Coffee. No drug use  Tobacco use  Former smoker.  Family History (April Staton, [place]; 06/09/2017 3:42 PM) Arthritis  Brother, Father, Mother. Cerebrovascular Accident  Mother. Colon Cancer  Father. Colon Polyps  Brother, Father, Son. Depression  Daughter, Mother, Son. Diabetes Mellitus  Brother, Father, Mother. Heart Disease  Brother, Father, Mother. Heart disease in male family member before age 63  Heart disease in male family member before age 28  Hypertension  Brother, Father, Mother. Ischemic Bowel Disease  Mother.  Other Problems (April Staton, CMA; 06/09/2017 3:42 PM) Hepatitis  Sleep Apnea     Review of Systems (April Staton CMA; 06/09/2017 3:42 PM)  General Present- Fatigue and Night Sweats. Not Present- Appetite Loss, Chills, Fever, Weight Gain and Weight Loss. Skin Not Present- Change in Wart/Mole, Dryness, Hives, Jaundice, New Lesions,  Non-Healing Wounds, Rash and Ulcer. HEENT Present- Seasonal Allergies. Not Present- Earache, Hearing Loss, Hoarseness, Nose Bleed, Oral Ulcers, Ringing in the Ears, Sinus Pain, Sore Throat, Visual Disturbances, Wears glasses/contact lenses and Yellow Eyes. Respiratory Present- Snoring. Not Present- Bloody sputum, Chronic Cough, Difficulty Breathing and Wheezing. Breast Not Present- Breast Mass, Breast Pain, Nipple Discharge and Skin Changes. Cardiovascular Not Present- Chest Pain, Difficulty Breathing Lying Down, Leg Cramps, Palpitations, Rapid Heart Rate, Shortness of Breath and Swelling of Extremities. Gastrointestinal Present- Abdominal Pain and Excessive gas. Not Present- Bloating, Bloody Stool, Change in Bowel Habits, Chronic diarrhea, Constipation, Difficulty Swallowing, Gets full quickly at meals, Hemorrhoids, Indigestion, Nausea, Rectal Pain and Vomiting. Male Genitourinary Not Present- Blood in Urine, Change in Urinary Stream, Frequency, Impotence, Nocturia, Painful Urination, Urgency and Urine Leakage. Musculoskeletal Present- Joint Pain. Not Present- Back Pain, Joint Stiffness, Muscle Pain, Muscle Weakness and Swelling of Extremities.  Vitals Sabino Gasser; 06/09/2017 3:13 PM) 06/09/2017 3:12 PM Weight: 225.5 lb Height: 70in Body Surface Area: 2.2 m Body Mass Index: 32.36 kg/m  Temp.: 98.48F(Oral)  Pulse: 108 (Regular)  BP: 132/80 (Sitting, Left Arm, Standard)  Physical Exam  General: WN WM alert and generally healthy appearing. HEENT: Normal. Pupils equal.  Neck: Supple. No mass. No thyroid mass.  Lymph Nodes: No supraclavicular or cervical nodes.  Lungs: Clear to auscultation and symmetric breath sounds. Heart: RRR. No murmur or rub.  Abdomen: Soft. No mass. No hernia. Normal bowel sounds.  Bandage in the RLQ, where the drain was. This is still a little tender.  Extremities: Good strength and ROM in upper and lower extremities.  Neurologic: Grossly  intact to motor and sensory function. Psychiatric: Has normal mood and affect. Behavior is normal.  Assessment & Plan  1.  HISTORY OF APPENDICITIS (Z87.19)  Plan:  1) Septra DS for symptoms   2) Schedule laparoscopic appendectomy   3) Dr. Watt Climes to do a colonoscopy the day before the appendectomy   Dr. Watt Climes called me 07/28/2017 and said that the colonoscopy was okay.  2. OSA 3. Dilated arota - stable 4. History of hep A. 5. For arthroscopic right knee surger by Dr. Jarvis Newcomer (delayed) His knee is bothering him enough that they have rented a scooter for Lowry Ram, MD, [facility] Pager: [phone] Office phone:  [phone]

## 2017-07-28 NOTE — Anesthesia Preprocedure Evaluation (Addendum)
Anesthesia Evaluation  Patient identified by MRN, date of birth, ID band Patient awake    Reviewed: Allergy & Precautions, NPO status , Patient's Chart, lab work & pertinent test results  Airway Mallampati: II  TM Distance: >3 FB Neck ROM: Full    Dental no notable dental hx.    Pulmonary sleep apnea , former smoker,    Pulmonary exam normal breath sounds clear to auscultation       Cardiovascular Exercise Tolerance: Good + Peripheral Vascular Disease  Normal cardiovascular exam Rhythm:Regular Rate:Normal     Neuro/Psych negative neurological ROS  negative psych ROS   GI/Hepatic negative GI ROS, Neg liver ROS,   Endo/Other    Renal/GU negative Renal ROS     Musculoskeletal negative musculoskeletal ROS (+)   Abdominal (+) + obese,   Peds  Hematology negative hematology ROS (+)   Anesthesia Other Findings   Reproductive/Obstetrics                            Lab Results  Component Value Date   WBC 4.9 07/25/2017   HGB 13.6 07/25/2017   HCT 40.8 07/25/2017   MCV 92.9 07/25/2017   PLT 215 07/25/2017    Anesthesia Physical Anesthesia Plan  ASA: II  Anesthesia Plan: General   Post-op Pain Management:    Induction: Intravenous  PONV Risk Score and Plan: 3 and Treatment may vary due to age or medical condition, Ondansetron and Dexamethasone  Airway Management Planned: Oral ETT  Additional Equipment:   Intra-op Plan:   Post-operative Plan: Extubation in OR  Informed Consent: I have reviewed the patients History and Physical, chart, labs and discussed the procedure including the risks, benefits and alternatives for the proposed anesthesia with the patient or authorized representative who has indicated his/her understanding and acceptance.   Dental advisory given  Plan Discussed with: CRNA and Anesthesiologist  Anesthesia Plan Comments:         Anesthesia Quick  Evaluation

## 2017-07-29 ENCOUNTER — Ambulatory Visit (HOSPITAL_COMMUNITY): Payer: No Typology Code available for payment source | Admitting: Anesthesiology

## 2017-07-29 ENCOUNTER — Encounter (HOSPITAL_COMMUNITY)
Admission: AD | Disposition: A | Discharge status: Home / Self Care | Payer: Self-pay | Source: Ambulatory Visit | Attending: Surgery

## 2017-07-29 ENCOUNTER — Observation Stay (HOSPITAL_COMMUNITY)
Admission: AD | Admit: 2017-07-29 | Discharge: 2017-07-30 | DRG: 343 | Disposition: A | Discharge status: Home / Self Care | Payer: No Typology Code available for payment source | Source: Ambulatory Visit | Attending: Surgery | Admitting: Surgery

## 2017-07-29 ENCOUNTER — Other Ambulatory Visit: Payer: Self-pay

## 2017-07-29 ENCOUNTER — Encounter (HOSPITAL_COMMUNITY): Payer: Self-pay | Admitting: Anesthesiology

## 2017-07-29 DIAGNOSIS — C181 Malignant neoplasm of appendix: Principal | ICD-10-CM | POA: Insufficient documentation

## 2017-07-29 DIAGNOSIS — Z8719 Personal history of other diseases of the digestive system: Secondary | ICD-10-CM

## 2017-07-29 DIAGNOSIS — Z87891 Personal history of nicotine dependence: Secondary | ICD-10-CM | POA: Diagnosis not present

## 2017-07-29 DIAGNOSIS — R1909 Other intra-abdominal and pelvic swelling, mass and lump: Secondary | ICD-10-CM | POA: Diagnosis present

## 2017-07-29 DIAGNOSIS — Z79899 Other long term (current) drug therapy: Secondary | ICD-10-CM | POA: Diagnosis not present

## 2017-07-29 DIAGNOSIS — Z7982 Long term (current) use of aspirin: Secondary | ICD-10-CM | POA: Diagnosis not present

## 2017-07-29 DIAGNOSIS — Z7951 Long term (current) use of inhaled steroids: Secondary | ICD-10-CM | POA: Diagnosis not present

## 2017-07-29 DIAGNOSIS — I739 Peripheral vascular disease, unspecified: Secondary | ICD-10-CM | POA: Insufficient documentation

## 2017-07-29 DIAGNOSIS — Z880 Allergy status to penicillin: Secondary | ICD-10-CM

## 2017-07-29 DIAGNOSIS — Z888 Allergy status to other drugs, medicaments and biological substances status: Secondary | ICD-10-CM

## 2017-07-29 DIAGNOSIS — G4733 Obstructive sleep apnea (adult) (pediatric): Secondary | ICD-10-CM | POA: Diagnosis not present

## 2017-07-29 HISTORY — PX: COLON RESECTION: SHX5231

## 2017-07-29 HISTORY — PX: LAPAROSCOPIC APPENDECTOMY: SHX408

## 2017-07-29 LAB — TYPE AND SCREEN
ABO/RH(D): A POS
Antibody Screen: NEGATIVE

## 2017-07-29 SURGERY — APPENDECTOMY, LAPAROSCOPIC
Anesthesia: General | Site: Abdomen | Laterality: Right

## 2017-07-29 MED ORDER — ROCURONIUM BROMIDE 10 MG/ML (PF) SYRINGE
PREFILLED_SYRINGE | INTRAVENOUS | Status: DC | PRN
  Administered 2017-07-29: 50 mg via INTRAVENOUS

## 2017-07-29 MED ORDER — ONDANSETRON HCL 4 MG/2ML IJ SOLN
INTRAMUSCULAR | Status: DC | PRN
  Administered 2017-07-29: 4 mg via INTRAVENOUS

## 2017-07-29 MED ORDER — HYDROCODONE-ACETAMINOPHEN 5-325 MG PO TABS
1.0000 | ORAL_TABLET | ORAL | Status: DC | PRN
  Administered 2017-07-29: 2 via ORAL
  Administered 2017-07-30: 1 via ORAL
  Filled 2017-07-29: qty 1
  Filled 2017-07-29: qty 2

## 2017-07-29 MED ORDER — ONDANSETRON HCL 4 MG/2ML IJ SOLN
INTRAMUSCULAR | Status: AC
  Filled 2017-07-29: qty 2

## 2017-07-29 MED ORDER — FLUTICASONE PROPIONATE 50 MCG/ACT NA SUSP
1.0000 | Freq: Every day | NASAL | Status: DC
  Filled 2017-07-29: qty 16

## 2017-07-29 MED ORDER — PROPOFOL 10 MG/ML IV BOLUS
INTRAVENOUS | Status: AC
  Filled 2017-07-29: qty 20

## 2017-07-29 MED ORDER — ACETAMINOPHEN 500 MG PO TABS
1000.0000 mg | ORAL_TABLET | ORAL | Status: AC
  Administered 2017-07-29: 1000 mg via ORAL
  Filled 2017-07-29: qty 2

## 2017-07-29 MED ORDER — BUPIVACAINE HCL (PF) 0.25 % IJ SOLN
INTRAMUSCULAR | Status: AC
  Filled 2017-07-29: qty 30

## 2017-07-29 MED ORDER — LIDOCAINE 2% (20 MG/ML) 5 ML SYRINGE
INTRAMUSCULAR | Status: DC | PRN
  Administered 2017-07-29: 1.5 mg/kg/h via INTRAVENOUS

## 2017-07-29 MED ORDER — HYDROMORPHONE HCL 1 MG/ML IJ SOLN
INTRAMUSCULAR | Status: AC
  Filled 2017-07-29: qty 1

## 2017-07-29 MED ORDER — LACTATED RINGERS IV SOLN
INTRAVENOUS | Status: DC | PRN
  Administered 2017-07-29: 06:00:00 via INTRAVENOUS

## 2017-07-29 MED ORDER — LIDOCAINE 2% (20 MG/ML) 5 ML SYRINGE
INTRAMUSCULAR | Status: AC
  Filled 2017-07-29: qty 5

## 2017-07-29 MED ORDER — HYDROCODONE-ACETAMINOPHEN 7.5-325 MG PO TABS: 1.0000 | ORAL_TABLET | Freq: Once | ORAL | Status: DC | PRN

## 2017-07-29 MED ORDER — CHLORHEXIDINE GLUCONATE 4 % EX LIQD: 60.0000 mL | Freq: Once | CUTANEOUS | Status: DC

## 2017-07-29 MED ORDER — GABAPENTIN 300 MG PO CAPS
300.0000 mg | ORAL_CAPSULE | ORAL | Status: AC
  Administered 2017-07-29: 300 mg via ORAL
  Filled 2017-07-29: qty 1

## 2017-07-29 MED ORDER — PROPOFOL 10 MG/ML IV BOLUS
INTRAVENOUS | Status: DC | PRN
  Administered 2017-07-29: 170 mg via INTRAVENOUS

## 2017-07-29 MED ORDER — PHENYLEPHRINE 40 MCG/ML (10ML) SYRINGE FOR IV PUSH (FOR BLOOD PRESSURE SUPPORT)
PREFILLED_SYRINGE | INTRAVENOUS | Status: AC
  Filled 2017-07-29: qty 10

## 2017-07-29 MED ORDER — PROMETHAZINE HCL 25 MG/ML IJ SOLN
INTRAMUSCULAR | Status: AC
  Filled 2017-07-29: qty 1

## 2017-07-29 MED ORDER — ROCURONIUM BROMIDE 10 MG/ML (PF) SYRINGE
PREFILLED_SYRINGE | INTRAVENOUS | Status: AC
  Filled 2017-07-29: qty 5

## 2017-07-29 MED ORDER — ALVIMOPAN 12 MG PO CAPS
12.0000 mg | ORAL_CAPSULE | ORAL | Status: AC
  Administered 2017-07-29: 12 mg via ORAL
  Filled 2017-07-29: qty 1

## 2017-07-29 MED ORDER — FENTANYL CITRATE (PF) 100 MCG/2ML IJ SOLN
INTRAMUSCULAR | Status: DC | PRN
  Administered 2017-07-29 (×2): 50 ug via INTRAVENOUS
  Administered 2017-07-29: 100 ug via INTRAVENOUS
  Administered 2017-07-29: 50 ug via INTRAVENOUS

## 2017-07-29 MED ORDER — TRAMADOL HCL 50 MG PO TABS: 50.0000 mg | ORAL_TABLET | Freq: Four times a day (QID) | ORAL | Status: DC | PRN

## 2017-07-29 MED ORDER — IBUPROFEN 200 MG PO TABS: 600.0000 mg | ORAL_TABLET | Freq: Four times a day (QID) | ORAL | Status: DC | PRN

## 2017-07-29 MED ORDER — FENTANYL CITRATE (PF) 250 MCG/5ML IJ SOLN
INTRAMUSCULAR | Status: AC
  Filled 2017-07-29: qty 5

## 2017-07-29 MED ORDER — ACETAMINOPHEN 10 MG/ML IV SOLN: 1000.0000 mg | Freq: Once | INTRAVENOUS | Status: DC | PRN

## 2017-07-29 MED ORDER — DEXAMETHASONE SODIUM PHOSPHATE 10 MG/ML IJ SOLN
INTRAMUSCULAR | Status: DC | PRN
  Administered 2017-07-29: 10 mg via INTRAVENOUS

## 2017-07-29 MED ORDER — HYDROMORPHONE HCL 1 MG/ML IJ SOLN
0.2500 mg | INTRAMUSCULAR | Status: DC | PRN
  Administered 2017-07-29: 0.5 mg via INTRAVENOUS

## 2017-07-29 MED ORDER — CEFOTETAN DISODIUM-DEXTROSE 2-2.08 GM-%(50ML) IV SOLR
2.0000 g | INTRAVENOUS | Status: AC
  Administered 2017-07-29: 2 g via INTRAVENOUS
  Filled 2017-07-29: qty 50

## 2017-07-29 MED ORDER — LIDOCAINE 2% (20 MG/ML) 5 ML SYRINGE
INTRAMUSCULAR | Status: DC | PRN
  Administered 2017-07-29: 100 mg via INTRAVENOUS

## 2017-07-29 MED ORDER — HEPARIN SODIUM (PORCINE) 5000 UNIT/ML IJ SOLN
5000.0000 [IU] | Freq: Three times a day (TID) | INTRAMUSCULAR | Status: DC
  Filled 2017-07-29 (×2): qty 1

## 2017-07-29 MED ORDER — KCL IN DEXTROSE-NACL 20-5-0.45 MEQ/L-%-% IV SOLN
INTRAVENOUS | Status: DC
  Administered 2017-07-29 (×2): via INTRAVENOUS
  Filled 2017-07-29 (×2): qty 1000

## 2017-07-29 MED ORDER — LACTATED RINGERS IV SOLN: INTRAVENOUS | Status: DC

## 2017-07-29 MED ORDER — MIDAZOLAM HCL 5 MG/5ML IJ SOLN
INTRAMUSCULAR | Status: DC | PRN
  Administered 2017-07-29: 2 mg via INTRAVENOUS

## 2017-07-29 MED ORDER — ONDANSETRON HCL 4 MG/2ML IJ SOLN
4.0000 mg | Freq: Four times a day (QID) | INTRAMUSCULAR | Status: DC | PRN
Start: 2017-07-29 — End: 2017-07-30

## 2017-07-29 MED ORDER — SUGAMMADEX SODIUM 200 MG/2ML IV SOLN
INTRAVENOUS | Status: DC | PRN
  Administered 2017-07-29: 200 mg via INTRAVENOUS

## 2017-07-29 MED ORDER — MORPHINE SULFATE (PF) 2 MG/ML IV SOLN: 1.0000 mg | INTRAVENOUS | Status: DC | PRN

## 2017-07-29 MED ORDER — ONDANSETRON 4 MG PO TBDP
4.0000 mg | ORAL_TABLET | Freq: Four times a day (QID) | ORAL | Status: DC | PRN
Start: 2017-07-29 — End: 2017-07-30

## 2017-07-29 MED ORDER — MEPERIDINE HCL 50 MG/ML IJ SOLN: 6.2500 mg | INTRAMUSCULAR | Status: DC | PRN

## 2017-07-29 MED ORDER — PROMETHAZINE HCL 25 MG/ML IJ SOLN
6.2500 mg | INTRAMUSCULAR | Status: DC | PRN
  Administered 2017-07-29: 6.25 mg via INTRAVENOUS

## 2017-07-29 MED ORDER — PHENYLEPHRINE 40 MCG/ML (10ML) SYRINGE FOR IV PUSH (FOR BLOOD PRESSURE SUPPORT)
PREFILLED_SYRINGE | INTRAVENOUS | Status: DC | PRN
  Administered 2017-07-29 (×3): 80 ug via INTRAVENOUS

## 2017-07-29 MED ORDER — BUPIVACAINE-EPINEPHRINE 0.25% -1:200000 IJ SOLN
INTRAMUSCULAR | Status: DC | PRN
  Administered 2017-07-29: 30 mL

## 2017-07-29 MED ORDER — MIDAZOLAM HCL 2 MG/2ML IJ SOLN
INTRAMUSCULAR | Status: AC
  Filled 2017-07-29: qty 2

## 2017-07-29 MED ORDER — SUGAMMADEX SODIUM 200 MG/2ML IV SOLN
INTRAVENOUS | Status: AC
  Filled 2017-07-29: qty 2

## 2017-07-29 MED ORDER — DEXAMETHASONE SODIUM PHOSPHATE 10 MG/ML IJ SOLN
INTRAMUSCULAR | Status: AC
Start: 2017-07-29 — End: ?
  Filled 2017-07-29: qty 1

## 2017-07-29 SURGICAL SUPPLY — 64 items
APPLICATOR COTTON TIP 6IN STRL (MISCELLANEOUS) ×6 IMPLANT
APPLIER CLIP ROT 10 11.4 M/L (STAPLE)
BENZOIN TINCTURE PRP APPL 2/3 (GAUZE/BANDAGES/DRESSINGS) IMPLANT
BLADE EXTENDED COATED 6.5IN (ELECTRODE) ×3 IMPLANT
BLADE HEX COATED 2.75 (ELECTRODE) ×3 IMPLANT
CABLE HIGH FREQUENCY MONO STRZ (ELECTRODE) ×3 IMPLANT
CHLORAPREP W/TINT 26ML (MISCELLANEOUS) ×3 IMPLANT
CLIP APPLIE ROT 10 11.4 M/L (STAPLE) IMPLANT
CLIP VESOCCLUDE LG 6/CT (CLIP) IMPLANT
COVER SURGICAL LIGHT HANDLE (MISCELLANEOUS) ×3 IMPLANT
CUTTER FLEX LINEAR 45M (STAPLE) ×3 IMPLANT
DECANTER SPIKE VIAL GLASS SM (MISCELLANEOUS) ×3 IMPLANT
DERMABOND ADVANCED (GAUZE/BANDAGES/DRESSINGS) ×1
DERMABOND ADVANCED .7 DNX12 (GAUZE/BANDAGES/DRESSINGS) ×2 IMPLANT
DRAPE LAPAROSCOPIC ABDOMINAL (DRAPES) ×3 IMPLANT
ELECT REM PT RETURN 15FT ADLT (MISCELLANEOUS) ×3 IMPLANT
ENDOLOOP SUT PDS II  0 18 (SUTURE)
ENDOLOOP SUT PDS II 0 18 (SUTURE) IMPLANT
GAUZE SPONGE 4X4 12PLY STRL (GAUZE/BANDAGES/DRESSINGS) ×3 IMPLANT
GLOVE BIOGEL PI IND STRL 7.0 (GLOVE) ×2 IMPLANT
GLOVE BIOGEL PI INDICATOR 7.0 (GLOVE) ×1
GLOVE EUDERMIC 7 POWDERFREE (GLOVE) ×6 IMPLANT
GLOVE SURG SIGNA 7.5 PF LTX (GLOVE) ×3 IMPLANT
GOWN SPEC L4 XLG W/TWL (GOWN DISPOSABLE) ×3 IMPLANT
GOWN STRL REUS W/ TWL XL LVL3 (GOWN DISPOSABLE) ×6 IMPLANT
GOWN STRL REUS W/TWL LRG LVL3 (GOWN DISPOSABLE) ×3 IMPLANT
GOWN STRL REUS W/TWL XL LVL3 (GOWN DISPOSABLE) ×9 IMPLANT
KIT BASIN OR (CUSTOM PROCEDURE TRAY) ×3 IMPLANT
LEGGING LITHOTOMY PAIR STRL (DRAPES) ×3 IMPLANT
NS IRRIG 1000ML POUR BTL (IV SOLUTION) ×6 IMPLANT
PACK COLON (CUSTOM PROCEDURE TRAY) ×3 IMPLANT
PACK GENERAL/GYN (CUSTOM PROCEDURE TRAY) ×3 IMPLANT
POUCH SPECIMEN RETRIEVAL 10MM (ENDOMECHANICALS) ×3 IMPLANT
RELOAD 45 VASCULAR/THIN (ENDOMECHANICALS) IMPLANT
RELOAD STAPLE TA45 3.5 REG BLU (ENDOMECHANICALS) ×3 IMPLANT
SCISSORS LAP 5X35 DISP (ENDOMECHANICALS) ×3 IMPLANT
SET IRRIG TUBING LAPAROSCOPIC (IRRIGATION / IRRIGATOR) ×3 IMPLANT
SHEARS HARMONIC ACE PLUS 36CM (ENDOMECHANICALS) ×3 IMPLANT
SLEEVE XCEL OPT CAN 5 100 (ENDOMECHANICALS) ×3 IMPLANT
STAPLER VISISTAT 35W (STAPLE) ×3 IMPLANT
STRIP CLOSURE SKIN 1/2X4 (GAUZE/BANDAGES/DRESSINGS) IMPLANT
SUT MNCRL AB 4-0 PS2 18 (SUTURE) ×3 IMPLANT
SUT NOV 1 T60/GS (SUTURE) IMPLANT
SUT NOVA NAB DX-16 0-1 5-0 T12 (SUTURE) IMPLANT
SUT NOVA T20/GS 25 (SUTURE) IMPLANT
SUT PDS AB 1 CTX 36 (SUTURE) IMPLANT
SUT PDS AB 1 TP1 96 (SUTURE) ×6 IMPLANT
SUT PROLENE 2 0 KS (SUTURE) IMPLANT
SUT SILK 2 0 (SUTURE) ×1
SUT SILK 2 0 SH CR/8 (SUTURE) ×3 IMPLANT
SUT SILK 2 0SH CR/8 30 (SUTURE) IMPLANT
SUT SILK 2-0 18XBRD TIE 12 (SUTURE) ×2 IMPLANT
SUT SILK 2-0 30XBRD TIE 12 (SUTURE) IMPLANT
SUT SILK 3 0 (SUTURE) ×1
SUT SILK 3 0 SH CR/8 (SUTURE) ×3 IMPLANT
SUT SILK 3-0 18XBRD TIE 12 (SUTURE) ×2 IMPLANT
SUT VIC AB 2-0 SH 18 (SUTURE) IMPLANT
TOWEL OR 17X26 10 PK STRL BLUE (TOWEL DISPOSABLE) ×3 IMPLANT
TOWEL OR NON WOVEN STRL DISP B (DISPOSABLE) ×3 IMPLANT
TRAY FOLEY MTR SLVR 16FR STAT (SET/KITS/TRAYS/PACK) ×3 IMPLANT
TRAY LAPAROSCOPIC (CUSTOM PROCEDURE TRAY) ×3 IMPLANT
TROCAR BLADELESS OPT 5 100 (ENDOMECHANICALS) ×3 IMPLANT
TROCAR XCEL BLUNT TIP 100MML (ENDOMECHANICALS) ×3 IMPLANT
YANKAUER SUCT BULB TIP NO VENT (SUCTIONS) ×3 IMPLANT

## 2017-07-29 NOTE — Anesthesia Procedure Notes (Signed)
Procedure Name: Intubation Date/Time: 07/29/2017 7:46 AM Performed by: Anne Fu, CRNA Pre-anesthesia Checklist: Patient identified, Emergency Drugs available, Suction available, Patient being monitored and Timeout performed Patient Re-evaluated:Patient Re-evaluated prior to induction Oxygen Delivery Method: Circle system utilized Preoxygenation: Pre-oxygenation with 100% oxygen Induction Type: IV induction Ventilation: Mask ventilation without difficulty Laryngoscope Size: Mac and 4 Grade View: Grade I Tube type: Oral Tube size: 7.5 mm Number of attempts: 1 Airway Equipment and Method: Stylet Placement Confirmation: ETT inserted through vocal cords under direct vision,  positive ETCO2 and breath sounds checked- equal and bilateral Secured at: 22 cm Tube secured with: Tape Dental Injury: Teeth and Oropharynx as per pre-operative assessment

## 2017-07-29 NOTE — Transfer of Care (Signed)
Immediate Anesthesia Transfer of Care Note  Patient: Daniel Humphrey  Procedure(s) Performed: Procedure(s): APPENDECTOMY LAPAROSCOPIC ERAS PATHWAY (N/A) POSSIBLE RIGHT COLON RESECTION (Right)  Patient Location: PACU  Anesthesia Type:General  Level of Consciousness:  sedated, patient cooperative and responds to stimulation  Airway & Oxygen Therapy:Patient Spontanous Breathing and Patient connected to face mask oxgen  Post-op Assessment:  Report given to PACU RN and Post -op Vital signs reviewed and stable  Post vital signs:  Reviewed and stable  Last Vitals:  Vitals:   07/29/17 0900  BP: 136/75  Pulse: 75  Resp: 18  Temp: 36.9 C  SpO2: 16%    Complications: No apparent anesthesia complications

## 2017-07-29 NOTE — Interval H&P Note (Signed)
History and Physical Interval Note:  07/29/2017 7:33 AM  Daniel Humphrey  has presented today for surgery, with the diagnosis of Appendicititis  The various methods of treatment have been discussed with the patient and family.  Wife at bedside.  After consideration of risks, benefits and other options for treatment, the patient has consented to  Procedure(s): [place] (N/A) POSSIBLE RIGHT COLON RESECTION (Right) as a surgical intervention .  The patient's history has been reviewed, patient examined, no change in status, stable for surgery.  I have reviewed the patient's chart and labs.  Questions were answered to the patient's satisfaction.     Shann Medal

## 2017-07-29 NOTE — Anesthesia Postprocedure Evaluation (Signed)
Anesthesia Post Note  Patient: Brick Ketcher Treinen  Procedure(s) Performed: APPENDECTOMY LAPAROSCOPIC ERAS PATHWAY (N/A Abdomen) POSSIBLE RIGHT COLON RESECTION (Right )     Patient location during evaluation: PACU Anesthesia Type: General Level of consciousness: awake and alert Pain management: pain level controlled Vital Signs Assessment: post-procedure vital signs reviewed and stable Respiratory status: spontaneous breathing, nonlabored ventilation, respiratory function stable and patient connected to nasal cannula oxygen Cardiovascular status: blood pressure returned to baseline and stable Postop Assessment: no apparent nausea or vomiting Anesthetic complications: no    Last Vitals:  Vitals:   07/29/17 1030 07/29/17 1045  BP: 112/70 125/75  Pulse: 77 72  Resp: 14 14  Temp:  36.9 C  SpO2: 97% 98%    Last Pain:  Vitals:   07/29/17 1030  PainSc: [place] A Jocilyn Trego

## 2017-07-29 NOTE — Op Note (Signed)
Re:   Daniel Humphrey [facility] DOB:   Mar 20, 1955 MRN:   [id number]                   FACILITY:  [facility]  DATE OF PROCEDURE: 07/29/2017                              OPERATIVE REPORT  PREOPERATIVE DIAGNOSIS:  History of appendicitis.  POSTOPERATIVE DIAGNOSIS:  History of appendicitis.  PROCEDURE:  Laparoscopic appendectomy. [Photos taken]  SURGEON:  Fenton Malling. Lucia Gaskins, MD  ASSISTANT:  No first assistant.  ANESTHESIA:  General endotracheal.  Anesthesiologist: Barnet Glasgow, MD CRNA: Anne Fu, CRNA  ASA:  2  ESTIMATED BLOOD LOSS:  Minimal.  DRAINS: none   SPECIMEN:   Appendix  COUNTS CORRECT:  YES  INDICATIONS FOR PROCEDURE: Daniel Humphrey is a 62 y.o. (DOB: 12/21/1955) white male whose primary care doctor is Shirline Frees, MD and comes to the OR for an appendectomy.   He had an appendiceal abscess percutaneously drained on 05/10/2017.  There was a question whether this was a tumor of the appendix.  His drain was removed on 06/08/2017.   The CT scan at that time showed resolution of the appendiceal abscess.  Because of the concern of underlying tumor, he comes for interval appendectomy.  Dr. Watt Climes did a colonoscopy yesterday to evaluate the rest of the colon and it was okay.  I discussed with the patient, the indications and potential complications of appendiceal surgery.  The potential complications include, but are not limited to, bleeding, open surgery, bowel resection, and the possibility of another diagnosis.  OPERATIVE NOTE:  The patient underwent a general endotracheal anesthetic as supervised by Anesthesiologist: Barnet Glasgow, MD CRNA: Anne Fu, CRNA, General, in [place] room #4.  The patient was given 2 g of cefotetan at the beginning of the procedure and the abdomen was prepped with ChloraPrep.   A time-out was held and surgical checklist run.  An infraumbilical incision was made with sharp dissection carried down to the abdominal cavity.  An 12 mm  Hasson trocar was inserted through the infraumbilical incision and into the peritoneal cavity.  A 30 degree 5 mm laparoscope was inserted through a 5 mm Hasson trocar and the Hasson trocar secured with a 0 Vicryl suture.  I placed a 5 mm trocar in the right upper quadrant and a 5 mm torcar in left lower quadrant and did abdominal exploration.    The right and left lobes of liver unremarkable.  Stomach was unremarkable.  The pelvic organs were unremarkable.  I saw no other intra-abdominal abnormality.  The patient's appendix was attached to the anterior peritoneum in the right lower quadrant. Photos were taken and placed in Epic.  I decided to divide the base of the appendix first, then remove the appendix from the anterior abdominal wall.  The mesentery of the appendix was divided with a Harmonic scalpel.  I got to the base of the appendix.  I then used a blue load 45 mm Ethicon Endo-GIA stapler and fired this across the base of the appendix.   I then took the appendix off the anterior abdominal wall with a 1 cm cuff of peritoneum.  I placed the appendix in EndoCatch bag and delivered the bag through the umbilical incision.  I irrigated the abdomen with 500 cc of saline.  After irrigating the abdomen, I then removed the trocars, in turn.  The umbilical port fascia was closed with 0 Vicryl suture.   I closed the skin each site with a 4-0 Monocryl suture and painted the wounds with DermaBond.  I then injected a total of 30 mL of 0.25% Marcaine at the incisions.  Sponge and needle count were correct at the end of the case.  The patient was transferred to the recovery room in good condition.  The patient tolerated the procedure well and it depends on the patient's post op clinical course as to when the patient could be discharged.   Right upper photo: show appendix attached to the anterior peritoneal surface. Left upper photo: show appendix attached to the anterior peritoneal surface and the base of the  appendix attached to the cecum  Right lower photo: show appendix attached to the anterior peritoneal surface and the base of appendix divided Left lower photo:  Peritoneum after appendiceal resection    Alphonsa Overall, MD, [facility] Pager: [phone] Office phone:  [phone]

## 2017-07-29 NOTE — Discharge Instructions (Signed)
[facility] [place] [facility] - DISCHARGE INSTRUCTIONS TO PATIENT  Return to work on:  2 weeks  Activity:  Driving - May drive in 2 or 3 days, if doing well   Lifting - No lifting more than 15 pounds for 2 weeks, then no limit  Wound Care:   May shower  Diet:  As tolerated  Follow up appointment:  Call Dr. Pollie Friar office [facility]) at [phone] for an appointment in 2 to 3 weeks.  Medications and dosages:  Resume your home medications.  You have a prescription for:  Vicodin  Call Dr. Lucia Gaskins or his office  [phone]) if you have:  Temperature greater than 100.4,  Persistent nausea and vomiting,  Severe uncontrolled pain,  Redness, tenderness, or signs of infection (pain, swelling, redness, odor or green/yellow discharge around the site),  Difficulty breathing, headache or visual disturbances,  Any other questions or concerns you may have after discharge.  In an emergency, call 911 or go to an Emergency Department at a nearby hospital.

## 2017-07-30 DIAGNOSIS — C181 Malignant neoplasm of appendix: Secondary | ICD-10-CM | POA: Diagnosis not present

## 2017-07-30 MED ORDER — HYDROCODONE-ACETAMINOPHEN 5-325 MG PO TABS: 1.0000 | ORAL_TABLET | Freq: Four times a day (QID) | ORAL | 0 refills | Status: DC | PRN

## 2017-07-30 MED ORDER — IBUPROFEN 800 MG PO TABS: 800.0000 mg | ORAL_TABLET | Freq: Three times a day (TID) | ORAL | 0 refills | Status: DC | PRN

## 2017-07-30 NOTE — Progress Notes (Signed)
1 Day Post-Op   Subjective/Chief Complaint: Looks great wants to  Go home    Objective: Vital signs in last 24 hours: Temp:  [97.6 F (36.4 C)-98.4 F (36.9 C)] 97.6 F (36.4 C) (05/18 0527) Pulse Rate:  [72-96] 76 (05/18 0527) Resp:  [13-19] 14 (05/18 0527) BP: (112-133)/(62-82) 127/65 (05/18 0527) SpO2:  [91 %-100 %] 98 % (05/18 0527) Weight:  [102.1 kg (225 lb)-104.3 kg (229 lb 15 oz)] 104.3 kg (229 lb 15 oz) (05/18 0500) Last BM Date: 07/28/17  Intake/Output from previous day: 05/17 0701 - 05/18 0700 In: 2395 [P.O.:1000; I.V.:1395] Out: 10 [Blood:10] Intake/Output this shift: No intake/output data recorded.  Incision/Wound:CDI soft NT   Lab Results:  No results for input(s): WBC, HGB, HCT, PLT in the last 72 hours. BMET No results for input(s): NA, K, CL, CO2, GLUCOSE, BUN, CREATININE, CALCIUM in the last 72 hours. PT/INR No results for input(s): LABPROT, INR in the last 72 hours. ABG No results for input(s): PHART, HCO3 in the last 72 hours.  Invalid input(s): PCO2, PO2  Studies/Results: No results found.  Anti-infectives: Anti-infectives (From admission, onward)   Start     Dose/Rate Route Frequency Ordered Stop   07/29/17 0600  cefoTEtan in Dextrose 5% (CEFOTAN) IVPB 2 g     2 g Intravenous On call to O.R. 07/29/17 0541 07/29/17 0751      Assessment/Plan: s/p Procedure(s): APPENDECTOMY LAPAROSCOPIC ERAS PATHWAY (N/A) POSSIBLE RIGHT COLON RESECTION (Right) Discharge  LOS: 1 day    Daniel Humphrey 07/30/2017

## 2017-07-30 NOTE — Progress Notes (Signed)
Nurse reviewed discharge orders with pt.  Pt verbalized understanding of discharge instructions, follow up appointments and new medications.  Pain medication was electronically sent to his pharmacy.

## 2017-07-30 NOTE — Discharge Summary (Signed)
Physician Discharge Summary  Patient ID: Daniel Humphrey MRN: [id number] DOB/AGE: Jan 14, 1956 62 y.o.  Admit date: 07/29/2017 Discharge date: 07/30/2017  Admission Diagnoses:history of appendicitis  Discharge Diagnoses:  Active Problems:   History of appendicitis   Discharged Condition: good  Hospital Course: Pt did well. He tolerated his diet and had good pain control. He was ambulating without difficulty.       Treatments: surgery: laparoscopic appendectomy   Discharge Exam: Blood pressure 127/65, pulse 76, temperature 97.6 F (36.4 C), temperature source Oral, resp. rate 14, height 5\' 10"  (1.778 m), weight 104.3 kg (229 lb 15 oz), SpO2 98 %. General appearance: alert and cooperative Resp: clear to auscultation bilaterally Cardio: regular rate and rhythm, S1, S2 normal, no murmur, click, rub or gallop Incision/Wound:CDI soft port sites ok  Disposition: Discharge disposition: 01-Home or Self Care       Discharge Instructions    Diet - low sodium heart healthy   Complete by:  As directed    Increase activity slowly   Complete by:  As directed         Signed: Joyice Faster Amera Banos 07/30/2017, 9:06 AM

## 2018-05-01 ENCOUNTER — Encounter: Payer: Self-pay | Admitting: Interventional Cardiology

## 2018-05-25 NOTE — Progress Notes (Signed)
Cardiology Office Note   Date:  05/26/2018   ID:  Daniel Humphrey, DOB 02/08/1956, MRN [id number]  PCP:  Shirline Frees, MD    No chief complaint on file.  RF for CAD  Wt Readings from Last 3 Encounters:  05/26/18 227 lb 12.8 oz (103.3 kg)  07/30/17 229 lb 15 oz (104.3 kg)  07/25/17 224 lb (101.6 kg)       History of Present Illness: Daniel Humphrey is a 63 y.o. male  who has had a strong family h/o CAD. Three older brothers with stents or CABG or MI. He also has been followed for a thoracic aortic aneurysm.  Negative nuclear study in 10/14.   He had a sleep study showing sleep apnea several years ago, but never followed up with CPAP therapy.  He never had knee surgery in 2019.  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  THis was done at [facility].  No cardiac issues.   He has gained some weight.    He has some DOE associated with left arm pain.  He has gained some weight as well.  This has gotten somewhat worse.    Denies : Chest pain. Dizziness. Leg edema. Nitroglycerin use. Orthopnea. Palpitations. Paroxysmal nocturnal dyspnea. Syncope.    Past Medical History:  Diagnosis Date  . Aorta disorder ([place])    dilated, followed by MRI's   . Aortic root dilation ([facility])   . Arthritis    OA- R knee, hands   . ED (erectile dysfunction)   . 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 High cholesterol   . OSA (obstructive sleep apnea) 04/15/2015   Severe OSA with AHI 35.2 and successful CPAP titration to 9cmH2O, needs CPAP, will continue to explore   . Seasonal allergies     Past Surgical History:  Procedure Laterality Date  . CERVICAL FUSION  1999  . COLON RESECTION Right 07/29/2017   Procedure: POSSIBLE RIGHT COLON RESECTION;  Surgeon: Alphonsa Overall, MD;  Location: [facility];  Service: General;  Laterality: Right;  . COLONOSCOPY WITH PROPOFOL  04/19/2012   Procedure: COLONOSCOPY WITH PROPOFOL;  Surgeon: Jeryl Columbia, MD;  Location: [facility];   Service: Endoscopy;  Laterality: N/A;  . COLONOSCOPY WITH PROPOFOL N/A 01/17/2015   Procedure: COLONOSCOPY WITH PROPOFOL;  Surgeon: Clarene Essex, MD;  Location: [facility];  Service: Endoscopy;  Laterality: N/A;  . HOT HEMOSTASIS  04/19/2012   Procedure: HOT HEMOSTASIS (ARGON PLASMA COAGULATION/BICAP);  Surgeon: Jeryl Columbia, MD;  Location: [facility];  Service: Endoscopy;  Laterality: N/A;  . HOT HEMOSTASIS N/A 01/17/2015   Procedure: HOT HEMOSTASIS (ARGON PLASMA COAGULATION/BICAP);  Surgeon: Clarene Essex, MD;  Location: [facility];  Service: Endoscopy;  Laterality: N/A;  . IR RADIOLOGIST EVAL & MGMT  05/25/2017  . IR RADIOLOGIST EVAL & MGMT  06/08/2017  . LAPAROSCOPIC APPENDECTOMY N/A 07/29/2017   Procedure: APPENDECTOMY LAPAROSCOPIC ERAS PATHWAY;  Surgeon: Alphonsa Overall, MD;  Location: [facility];  Service: General;  Laterality: N/A;  . NASAL SEPTUM SURGERY  1998     Current Outpatient Medications  Medication Sig Dispense Refill  . aspirin 81 MG tablet Take 81 mg by mouth daily.    . cetirizine-pseudoephedrine (ZYRTEC-D) 5-120 MG per tablet Take 1 tablet by mouth at bedtime.    . fluticasone (FLONASE) 50 MCG/ACT nasal spray Place 1 spray into the nose daily.    Marland Kitchen ibuprofen (ADVIL,MOTRIN) 800 MG tablet Take 1 tablet (800 mg total) by  mouth every 8 (eight) hours as needed. 30 tablet 0  . Multiple Vitamin (MULTIVITAMIN WITH MINERALS) TABS Take 1 tablet by mouth daily.    . naproxen sodium (ALEVE) 220 MG tablet Take 440 mg by mouth daily as needed (pain).    . simvastatin (ZOCOR) 10 MG tablet Take 10 mg by mouth daily at 6 PM.      No current facility-administered medications for this visit.     Allergies:   Patient has no known allergies.    Social History:  The patient  reports that he quit smoking about 30 years ago. He has a 3.00 pack-year smoking history. He has never used smokeless tobacco. He reports current alcohol use. He reports that he does not use drugs.   Family History:  The  patient's \ family history includes Heart attack in his brother, brother, father, and mother; Heart disease in his brother and brother; Hyperlipidemia in his father and mother; Hypertension in his sister.    ROS:  Please see the history of present illness.   Otherwise, review of systems are positive for DOE.   All other systems are reviewed and negative.    PHYSICAL EXAM: VS:  BP (!) 142/88   Pulse 95   Ht 5\' 10"  (1.778 m)   Wt 227 lb 12.8 oz (103.3 kg)   SpO2 96%   BMI 32.69 kg/m  , BMI Body mass index is 32.69 kg/m. GEN: Well nourished, well developed, in no acute distress  HEENT: normal  Neck: no JVD, carotid bruits, or masses Cardiac: RRR; no murmurs, rubs, or gallops,no edema  Respiratory:  clear to auscultation bilaterally, normal work of breathing GI: soft, nontender, nondistended, + BS MS: no deformity or atrophy  Skin: warm and dry, no rash Neuro:  Strength and sensation are intact Psych: euthymic mood, full affect   EKG:   The ekg ordered today demonstrates NSR, mildly widened QRS, no ST changes   Recent Labs: 07/25/2017: BUN 20; Creatinine, Ser 0.85; Hemoglobin 13.6; Platelets 215; Potassium 4.9; Sodium 143   Lipid Panel No results found for: CHOL, TRIG, HDL, CHOLHDL, VLDL, LDLCALC, LDLDIRECT   Other studies Reviewed: Additional studies/ records that were reviewed today with results demonstrating: 2014 nuclear stress test negative for ischemia..   ASSESSMENT AND PLAN:  1. DOE: Concerning that this could be an anginal equivalent, especially given his left arm pain as well.  It is been several years since he has had an ischemic evaluation.  Will check CTA coronaries to evaluate coronary anatomy and look for degree of atherosclerosis.  Significant family history of CAD. 2. THoracic aortic aneurysm: Aortic root dilated by echo.  3. Family h/o CAD: Continue statin.  May have to intensify dose depending on results of coronary CT. 4. Hyperlipidemia: LDL was 102 in  July 2019. 5. OSA: We will repeat sleep study.  He is now interested in CPAP given that his sleep quality has decreased.   Current medicines are reviewed at length with the patient today.  The patient concerns regarding his medicines were addressed.  The following changes have been made:  No change  Labs/ tests ordered today include:  No orders of the defined types were placed in this encounter.   Recommend 150 minutes/week of aerobic exercise Low fat, low carb, high fiber diet recommended  Disposition:   FU in 1 year, and for tests   Signed, Larae Grooms, MD  05/26/2018 8:38 AM    [facility]  [address] [place], [place]  [address] Phone: [phone]; Fax: [phone]

## 2018-05-26 ENCOUNTER — Encounter (INDEPENDENT_AMBULATORY_CARE_PROVIDER_SITE_OTHER): Payer: Self-pay

## 2018-05-26 ENCOUNTER — Ambulatory Visit: Payer: No Typology Code available for payment source | Admitting: Interventional Cardiology

## 2018-05-26 ENCOUNTER — Encounter: Payer: Self-pay | Admitting: Interventional Cardiology

## 2018-05-26 ENCOUNTER — Other Ambulatory Visit: Payer: Self-pay

## 2018-05-26 VITALS — BP 142/88 | HR 95 | Ht 70.0 in | Wt 227.8 lb

## 2018-05-26 DIAGNOSIS — Z8249 Family history of ischemic heart disease and other diseases of the circulatory system: Secondary | ICD-10-CM

## 2018-05-26 DIAGNOSIS — Z01812 Encounter for preprocedural laboratory examination: Secondary | ICD-10-CM

## 2018-05-26 DIAGNOSIS — M79602 Pain in left arm: Secondary | ICD-10-CM

## 2018-05-26 DIAGNOSIS — E785 Hyperlipidemia, unspecified: Secondary | ICD-10-CM | POA: Diagnosis not present

## 2018-05-26 DIAGNOSIS — I77810 Thoracic aortic ectasia: Secondary | ICD-10-CM

## 2018-05-26 DIAGNOSIS — R0609 Other forms of dyspnea: Secondary | ICD-10-CM | POA: Diagnosis not present

## 2018-05-26 DIAGNOSIS — G4733 Obstructive sleep apnea (adult) (pediatric): Secondary | ICD-10-CM

## 2018-05-26 DIAGNOSIS — R0600 Dyspnea, unspecified: Secondary | ICD-10-CM

## 2018-05-26 MED ORDER — METOPROLOL TARTRATE 100 MG PO TABS: ORAL_TABLET | ORAL | 0 refills | Status: DC

## 2018-05-26 NOTE — Patient Instructions (Signed)
Medication Instructions:  Your physician recommends that you continue on your current medications as directed. Please refer to the Current Medication list given to you today.  If you need a refill on your cardiac medications before your next appointment, please call your pharmacy.   Lab work: Your physician recommends that you return for lab work Artist) prior to Cardiac CT   If you have labs (blood work) drawn today and your tests are completely normal, you will receive your results only by: Marland Kitchen MyChart Message (if you have MyChart) OR . A paper copy in the mail If you have any lab test that is abnormal or we need to change your treatment, we will call you to review the results.  Testing/Procedures: Your physician has recommended that you have a sleep study. This test records several body functions during sleep, including: brain activity, eye movement, oxygen and carbon dioxide blood levels, heart rate and rhythm, breathing rate and rhythm, the flow of air through your mouth and nose, snoring, body muscle movements, and chest and belly movement.  Your physician has requested that you have cardiac CT. Cardiac computed tomography (CT) is a painless test that uses an x-ray machine to take clear, detailed pictures of your heart. For further information please visit [email or web address][facility][email or web address]. Please follow instruction sheet as given.   Follow-Up: At [facility], you and your health needs are our priority.  As part of our continuing mission to provide you with exceptional heart care, we have created designated Provider Care Teams.  These Care Teams include your primary Cardiologist (physician) and Advanced Practice Providers (APPs -  Physician Assistants and Nurse Practitioners) who all work together to provide you with the care you need, when you need it. . You will need a follow up appointment in 1 year.  Please call our office 2 months in advance to schedule this appointment.  You may see Casandra Doffing,  MD or one of the following Advanced Practice Providers on your designated Care Team:   . Lyda Jester, PA-C . Dayna Dunn, PA-C . Ermalinda Barrios, PA-C  Any Other Special Instructions Will Be Listed Below (If Applicable).  CARDIAC CT INSTRUCTIONS  Please arrive at the [facility] main entrance of [facility] on ____ at ____ (30-45 minutes prior to test start time)  [facility] [place], [place] [address] [phone]  Proceed to the [facility] Radiology Department (First Floor).  Please follow these instructions carefully (unless otherwise directed):   On the Night Before the Test: . Be sure to Drink plenty of water. . Do not consume any caffeinated/decaffeinated beverages or chocolate 12 hours prior to your test. . Do not take any antihistamines 12 hours prior to your test.  On the Day of the Test: . Drink plenty of water. Do not drink any water within one hour of the test. . Do not eat any food 4 hours prior to the test. . You may take your regular medications prior to the test.  . Take metoprolol (Lopressor) two hours prior to test.     After the Test: . Drink plenty of water. . After receiving IV contrast, you may experience a mild flushed feeling. This is normal. . On occasion, you may experience a mild rash up to 24 hours after the test. This is not dangerous. If this occurs, you can take Benadryl 25 mg and increase your fluid intake. . If you experience trouble breathing, this can be serious. If it is severe  call 911 IMMEDIATELY. If it is mild, please call our office.

## 2018-08-10 ENCOUNTER — Telehealth: Payer: Self-pay | Admitting: Interventional Cardiology

## 2018-08-10 NOTE — Telephone Encounter (Signed)
Left message to call and schedule cardiac ct.

## 2018-08-11 NOTE — Telephone Encounter (Signed)
Follow up:     Patient returning call back from yesterday. Please call patient back. 

## 2018-08-16 ENCOUNTER — Telehealth: Payer: Self-pay | Admitting: *Deleted

## 2018-08-16 NOTE — Telephone Encounter (Signed)
    COVID-19 Pre-Screening Questions:  . In the past 7 to 10 days have you had a cough,  shortness of breath, headache, congestion, fever (100 or greater) body aches, chills, sore throat, or sudden loss of taste or sense of smell? . Have you been around anyone with known Covid 19. . Have you been around anyone who is awaiting Covid 19 test results in the past 7 to 10 days? . Have you been around anyone who has been exposed to Covid 19, or has mentioned symptoms of Covid 19 within the past 7 to 10 days?  If you have any concerns/questions about symptoms patients report during screening (either on the phone or at threshold). Contact the provider seeing the patient or DOD for further guidance.  If neither are available contact a member of the leadership team.           Patient contact via telephone call. Got voice mail. Awainting return call. KB

## 2018-08-17 ENCOUNTER — Telehealth: Payer: Self-pay | Admitting: *Deleted

## 2018-08-17 NOTE — Telephone Encounter (Signed)
    COVID-19 Pre-Screening Questions:  . In the past 7 to 10 days have you had a cough,  shortness of breath, headache, congestion, fever (100 or greater) body aches, chills, sore throat, or sudden loss of taste or sense of smell? . Have you been around anyone with known Covid 19. . Have you been around anyone who is awaiting Covid 19 test results in the past 7 to 10 days? . Have you been around anyone who has been exposed to Covid 19, or has mentioned symptoms of Covid 19 within the past 7 to 10 days?  If you have any concerns/questions about symptoms patients report during screening (either on the phone or at threshold). Contact the provider seeing the patient or DOD for further guidance.  If neither are available contact a member of the leadership team.           Contacted patient via telephone call. No to covid19 questions. Has a mask.  KB

## 2018-08-18 ENCOUNTER — Telehealth (HOSPITAL_COMMUNITY): Payer: Self-pay | Admitting: Emergency Medicine

## 2018-08-18 ENCOUNTER — Other Ambulatory Visit: Payer: No Typology Code available for payment source | Admitting: *Deleted

## 2018-08-18 ENCOUNTER — Other Ambulatory Visit: Payer: Self-pay

## 2018-08-18 DIAGNOSIS — Z8249 Family history of ischemic heart disease and other diseases of the circulatory system: Secondary | ICD-10-CM

## 2018-08-18 DIAGNOSIS — Z01812 Encounter for preprocedural laboratory examination: Secondary | ICD-10-CM

## 2018-08-18 DIAGNOSIS — R0609 Other forms of dyspnea: Secondary | ICD-10-CM

## 2018-08-18 DIAGNOSIS — R0600 Dyspnea, unspecified: Secondary | ICD-10-CM

## 2018-08-18 DIAGNOSIS — M79602 Pain in left arm: Secondary | ICD-10-CM

## 2018-08-18 LAB — BASIC METABOLIC PANEL
BUN/Creatinine Ratio: 20 (ref 10–24)
BUN: 20 mg/dL (ref 8–27)
CO2: 23 mmol/L (ref 20–29)
Calcium: 9.5 mg/dL (ref 8.6–10.2)
Chloride: 101 mmol/L (ref 96–106)
Creatinine, Ser: 1 mg/dL (ref 0.76–1.27)
GFR calc Af Amer: 93 mL/min/{1.73_m2} (ref >59)
GFR calc non Af Amer: 81 mL/min/{1.73_m2} (ref >59)
Glucose: 93 mg/dL (ref 65–99)
Potassium: 4.4 mmol/L (ref 3.5–5.2)
Sodium: 139 mmol/L (ref 134–144)

## 2018-08-18 NOTE — Telephone Encounter (Signed)
Reaching out to patient to offer assistance regarding upcoming cardiac imaging study; pt verbalizes understanding of appt date/time, parking situation and where to check in, pre-test NPO status and medications ordered, and verified current allergies; name and call back number provided for further questions should they arise Kayln Garceau RN Navigator Cardiac Imaging [facility] [phone] office [phone] cell  Pt denies covid symptoms, verbalized understanding of visitor policy. 

## 2018-08-21 ENCOUNTER — Telehealth: Payer: Self-pay | Admitting: Interventional Cardiology

## 2018-08-21 NOTE — Telephone Encounter (Signed)
New Message           Patient has a procedure tomorrow and is unsure about the eating directions. Pls call to advise

## 2018-08-21 NOTE — Telephone Encounter (Signed)
Called and spoke to patient. Reviewed instructions below with the patient. Patient verbalized understanding and thanked me for the call.  CARDIAC CT INSTRUCTIONS  Please arrive at the [facility] main entrance of [facility] on ____ at ____ (30-45 minutes prior to test start time)  [facility] [place], [place] [address] [phone]  Proceed to the [facility] Radiology Department (First Floor).  Please follow these instructions carefully (unless otherwise directed):   On the Night Before the Test:  Be sure to Drink plenty of water.  Do not consume any caffeinated/decaffeinated beverages or chocolate 12 hours prior to your test.  Do not take any antihistamines 12 hours prior to your test.  On the Day of the Test:  Drink plenty of water. Do not drink any water within one hour of the test.  Do not eat any food 4 hours prior to the test.  You may take your regular medications prior to the test.   Take metoprolol (Lopressor) two hours prior to test.     After the Test:  Drink plenty of water.  After receiving IV contrast, you may experience a mild flushed feeling. This is normal.  On occasion, you may experience a mild rash up to 24 hours after the test. This is not dangerous. If this occurs, you can take Benadryl 25 mg and increase your fluid intake.  If you experience trouble breathing, this can be serious. If it is severe call 911 IMMEDIATELY. If it is mild, please call our office.

## 2018-08-21 NOTE — Telephone Encounter (Signed)
Follow up:    Patient returning your call back. [phone]

## 2018-08-21 NOTE — Telephone Encounter (Signed)
Left message for patient to call back  

## 2018-08-22 ENCOUNTER — Ambulatory Visit (HOSPITAL_COMMUNITY)
Admission: RE | Admit: 2018-08-22 | Discharge: 2018-08-22 | Disposition: A | Discharge status: Home / Self Care | Payer: No Typology Code available for payment source | Source: Ambulatory Visit | Attending: Interventional Cardiology | Admitting: Interventional Cardiology

## 2018-08-22 ENCOUNTER — Other Ambulatory Visit: Payer: Self-pay

## 2018-08-22 ENCOUNTER — Encounter (HOSPITAL_COMMUNITY): Payer: Self-pay

## 2018-08-22 ENCOUNTER — Ambulatory Visit (HOSPITAL_COMMUNITY): Payer: No Typology Code available for payment source

## 2018-08-22 DIAGNOSIS — I719 Aortic aneurysm of unspecified site, without rupture: Secondary | ICD-10-CM | POA: Diagnosis not present

## 2018-08-22 DIAGNOSIS — Z8249 Family history of ischemic heart disease and other diseases of the circulatory system: Secondary | ICD-10-CM | POA: Insufficient documentation

## 2018-08-22 DIAGNOSIS — M79602 Pain in left arm: Secondary | ICD-10-CM | POA: Diagnosis present

## 2018-08-22 DIAGNOSIS — E785 Hyperlipidemia, unspecified: Secondary | ICD-10-CM | POA: Insufficient documentation

## 2018-08-22 DIAGNOSIS — R0609 Other forms of dyspnea: Secondary | ICD-10-CM | POA: Diagnosis not present

## 2018-08-22 DIAGNOSIS — R0600 Dyspnea, unspecified: Secondary | ICD-10-CM

## 2018-08-22 MED ORDER — NITROGLYCERIN 0.4 MG SL SUBL
0.8000 mg | SUBLINGUAL_TABLET | Freq: Once | SUBLINGUAL | Status: AC
  Administered 2018-08-22: 0.8 mg via SUBLINGUAL
  Filled 2018-08-22: qty 25

## 2018-08-22 MED ORDER — NITROGLYCERIN 0.4 MG SL SUBL
SUBLINGUAL_TABLET | SUBLINGUAL | Status: AC
  Filled 2018-08-22: qty 2

## 2018-08-22 MED ORDER — IOHEXOL 350 MG/ML SOLN
100.0000 mL | Freq: Once | INTRAVENOUS | Status: AC | PRN
  Administered 2018-08-22: 100 mL via INTRAVENOUS

## 2018-08-25 ENCOUNTER — Other Ambulatory Visit: Payer: Self-pay

## 2018-08-25 DIAGNOSIS — I77810 Thoracic aortic ectasia: Secondary | ICD-10-CM

## 2018-11-30 ENCOUNTER — Telehealth: Payer: Self-pay | Admitting: *Deleted

## 2018-11-30 NOTE — Telephone Encounter (Signed)
   [facility] Pre-operative Risk Assessment    Request for surgical clearance:  1. What type of surgery is being performed? RIGHT PARTIAL vs. TOTAL KNEE REPLACEMENT    2. When is this surgery scheduled? TBD   3. What type of clearance is required (medical clearance vs. Pharmacy clearance to hold med vs. Both)? MEDICAL  4. Are there any medications that need to be held prior to surgery and how long? ASA; SURGEON STATES PT NEEDS SLEEP STUDY ASAP    5. Practice name and name of physician performing surgery? [facility]; DR. Noemi Chapel   6. What is your office phone number [phone]    7.   What is your office fax number [phone]  8.   Anesthesia type (None, local, MAC, general) ? CHOICE   Julaine Hua 11/30/2018, 1:25 PM  _________________________________________________________________   (provider comments below)

## 2018-12-01 ENCOUNTER — Telehealth: Payer: Self-pay | Admitting: *Deleted

## 2018-12-01 DIAGNOSIS — G4733 Obstructive sleep apnea (adult) (pediatric): Secondary | ICD-10-CM

## 2018-12-01 NOTE — Telephone Encounter (Signed)
I left message for the pt to call back to schedule an appt with Dr. Irish Lack or his Care Team for surgery clearance.

## 2018-12-01 NOTE — Telephone Encounter (Signed)
Per Dr Scarlette Calico sleep study ordered:  OSA: We will repeat sleep study.  He is now interested in CPAP given that his sleep quality has decreased.

## 2018-12-01 NOTE — Telephone Encounter (Signed)
   Steele, MD  Chart reviewed as part of pre-operative protocol coverage. Because of Daniel Humphrey's past medical history and time since last visit, he/she will require a follow-up visit in order to better assess preoperative cardiovascular risk.  Pre-op covering staff: - Please schedule appointment and call patient to inform them. - Please contact requesting surgeon's office via preferred method (i.e, phone, fax) to inform them of need for appointment prior to surgery.  If applicable, this message will also be routed to pharmacy pool and/or primary cardiologist for input on holding anticoagulant/antiplatelet agent as requested below so that this information is available at time of patient's appointment.   Also per pre-op surgical request, procedure team states that the patient needs a sleep study as well.   Kathyrn Drown, NP  12/01/2018, 9:25 AM

## 2018-12-01 NOTE — Telephone Encounter (Signed)
Sleep study PA submitted to [employer] via web portal. 

## 2018-12-04 NOTE — Telephone Encounter (Signed)
Pt has appt scheduled 12-08-2018

## 2018-12-05 ENCOUNTER — Encounter: Payer: Self-pay | Admitting: Physician Assistant

## 2018-12-05 NOTE — Progress Notes (Addendum)
Cardiology Office Note    Date:  12/08/2018   ID:  Daniel Humphrey, DOB 1955/09/22, MRN [id number]  PCP:  Shirline Frees, MD  Cardiologist:  Larae Grooms, MD  Electrophysiologist:  None   Chief Complaint: pre-op clearance  History of Present Illness:   Daniel Humphrey is a 63 y.o. male with history of thoracic aortic aneurysm, mild CAD by cardiac CTA 08/2018, mild AI, sleep apnea, obesity, arthritis, ED, hepatitis A, HLD who presents for pre-operative clearance. He has a strong family history of CAD and therefore has followed with cardiology for this and thoracic aortic aneurysm. Remote echo 2014 showed EF 55-60%, mild AI, mild-moderate aortic root dilatation. At last OV in 05/2018 he was seen for DOE in context of weight gain. Coronary CT 08/2018 showed minimal nonobstructive CAD, mild ascending aorta dilation with maximum diameter 42x41 mm, annual f/u recommended. Last labs 08/2018 showed K 4.4, 07/2017 showed normal CBC, A1C 5.9, LFTs wnl. Lipids are followed by primary care.  He is seen for pre-op clearance for knee replacement. He is very active at his job and at one point clocked walking 7 miles per day at work. He is able to lift boxes and do stairs without angina. He has mild DOE but this is stable. Sleep study is pending for evaluation of OSA, which he knows he has, but just has to be tested again for.    Past Medical History:  Diagnosis Date  . Aortic root dilation (HCC)   . Arthritis    OA- R knee, hands   . ED (erectile dysfunction)   . 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 High cholesterol   . Mild CAD    a. by cardiac CT 08/2018.  . Obesity   . OSA (obstructive sleep apnea) 04/15/2015   Severe OSA with AHI 35.2 and successful CPAP titration to 9cmH2O, needs CPAP, will continue to explore   . Seasonal allergies     Past Surgical History:  Procedure Laterality Date  . CERVICAL FUSION  1999  . COLON RESECTION Right 07/29/2017   Procedure: POSSIBLE RIGHT COLON RESECTION;   Surgeon: Alphonsa Overall, MD;  Location: [facility];  Service: General;  Laterality: Right;  . COLONOSCOPY WITH PROPOFOL  04/19/2012   Procedure: COLONOSCOPY WITH PROPOFOL;  Surgeon: Jeryl Columbia, MD;  Location: [facility];  Service: Endoscopy;  Laterality: N/A;  . COLONOSCOPY WITH PROPOFOL N/A 01/17/2015   Procedure: COLONOSCOPY WITH PROPOFOL;  Surgeon: Clarene Essex, MD;  Location: [facility];  Service: Endoscopy;  Laterality: N/A;  . HOT HEMOSTASIS  04/19/2012   Procedure: HOT HEMOSTASIS (ARGON PLASMA COAGULATION/BICAP);  Surgeon: Jeryl Columbia, MD;  Location: [facility];  Service: Endoscopy;  Laterality: N/A;  . HOT HEMOSTASIS N/A 01/17/2015   Procedure: HOT HEMOSTASIS (ARGON PLASMA COAGULATION/BICAP);  Surgeon: Clarene Essex, MD;  Location: [facility];  Service: Endoscopy;  Laterality: N/A;  . IR RADIOLOGIST EVAL & MGMT  05/25/2017  . IR RADIOLOGIST EVAL & MGMT  06/08/2017  . LAPAROSCOPIC APPENDECTOMY N/A 07/29/2017   Procedure: APPENDECTOMY LAPAROSCOPIC ERAS PATHWAY;  Surgeon: Alphonsa Overall, MD;  Location: [facility];  Service: General;  Laterality: N/A;  . NASAL SEPTUM SURGERY  1998    Current Medications: Current Meds  Medication Sig  . aspirin 81 MG tablet Take 81 mg by mouth daily.  . cetirizine-pseudoephedrine (ZYRTEC-D) 5-120 MG per tablet Take 1 tablet by mouth at bedtime.  . fluticasone (FLONASE) 50 MCG/ACT nasal spray Place 1 spray into the nose daily.  Marland Kitchen  ibuprofen (ADVIL,MOTRIN) 800 MG tablet Take 1 tablet (800 mg total) by mouth every 8 (eight) hours as needed.  . Multiple Vitamin (MULTIVITAMIN WITH MINERALS) TABS Take 1 tablet by mouth daily.  . naproxen sodium (ALEVE) 220 MG tablet Take 440 mg by mouth daily as needed (pain).  . simvastatin (ZOCOR) 10 MG tablet Take 10 mg by mouth daily at 6 PM.      Allergies:   Patient has no known allergies.   Social History   Socioeconomic History  . Marital status: Married    Spouse name: Not on file  . Number of children: Not on file  .  Years of education: Not on file  . Highest education level: Not on file  Occupational History  . Not on file  Social Needs  . Financial resource strain: Not on file  . Food insecurity    Worry: Not on file    Inability: Not on file  . Transportation needs    Medical: Not on file    Non-medical: Not on file  Tobacco Use  . Smoking status: Former Smoker    Packs/day: 1.00    Years: 3.00    Pack years: 3.00    Quit date: 07/14/1987    Years since quitting: 31.4  . Smokeless tobacco: Never Used  Substance and Sexual Activity  . Alcohol use: Yes    Comment: rare  . Drug use: No  . Sexual activity: Not on file  Lifestyle  . Physical activity    Days per week: Not on file    Minutes per session: Not on file  . Stress: Not on file  Relationships  . Social Herbalist on phone: Not on file    Gets together: Not on file    Attends religious service: Not on file    Active member of club or organization: Not on file    Attends meetings of clubs or organizations: Not on file    Relationship status: Not on file  Other Topics Concern  . Not on file  Social History Narrative  . Not on file     Family History:  The patient's family history includes Heart attack in his brother, brother, father, and mother; Heart disease in his brother and brother; Hyperlipidemia in his father and mother; Hypertension in his sister.  ROS:   Please see the history of present illness.  All other systems are reviewed and otherwise negative.    EKGs/Labs/Other Studies Reviewed:    Studies reviewed were summarized above.   EKG:  EKG is ordered today, personally reviewed, demonstrating NSR 71bpm, nonspeciifc STT changes (early upsloping II, TWI III) similar to prior  Recent Labs: 08/18/2018: BUN 20; Creatinine, Ser 1.00; Potassium 4.4; Sodium 139  Recent Lipid Panel No results found for: CHOL, TRIG, HDL, CHOLHDL, VLDL, LDLCALC, LDLDIRECT  PHYSICAL EXAM:    VS:  BP 130/78   Pulse 71   Ht  5\' 10"  (1.778 m)   Wt 223 lb 6.4 oz (101.3 kg)   SpO2 99%   BMI 32.05 kg/m   BMI: Body mass index is 32.05 kg/m.  GEN: Well nourished, well developed WM, in no acute distress HEENT: normocephalic, atraumatic Neck: no JVD, carotid bruits, or masses Cardiac: RRR; no murmurs, rubs, or gallops, no edema  Respiratory:  clear to auscultation bilaterally, normal work of breathing GI: soft, nontender, nondistended, + BS MS: no deformity or atrophy Skin: warm and dry, no rash Neuro:  Alert and Oriented x  3, Strength and sensation are intact, follows commands Psych: euthymic mood, full affect  Wt Readings from Last 3 Encounters:  12/08/18 223 lb 6.4 oz (101.3 kg)  05/26/18 227 lb 12.8 oz (103.3 kg)  07/30/17 229 lb 15 oz (104.3 kg)     ASSESSMENT & PLAN:   1. Pre-operative cardiovascular clearance - he is able to exceed 4 METS without angina and recent coronary CTA was without obstructive disease. Therefore, based on ACC/AHA guidelines, Daniel Humphrey would be at acceptable risk for the planned procedure without further cardiovascular testing. They have requested sleep study prior to surgery for his sleep apnea which is the only other thing still pending. If needed, he can hold ASA for the procedure from a cardiac standpoint as he has no history of MI or PCI. I will route this note to requesting surgeon. 2. Mild CAD - continue risk factor modification.  3. Dilation of ascending aorta - will arrange repeat MR angiogram in 08/2019. Aneurysm precautions reviewed with patient (see AVS - BP, activity, avoidance of fluoroquinolones). His BP is upper limits of normal today. He was hypertensive at last OV 05/2018 at 142/88. He is pending repeat sleep study/CPAP titration which may positively benefit his blood pressure. His goal would be closer to the 120 range given his TAA. I asked him to begin following at home and to send in results for our review. Carvedilol would be a consideration. 4. Mild aortic  insufficiency - per guidelines, repeat echo due 3-5 years after diagnosis. Last echocardiogram was 2014. Will arrange. This does not have to be completed prior to surgery.  Disposition: F/u with Dr Irish Lack in 08/2019 after MR as above.  Medication Adjustments/Labs and Tests Ordered: Current medicines are reviewed at length with the patient today.  Concerns regarding medicines are outlined above. Medication changes, Labs and Tests ordered today are summarized above and listed in the Patient Instructions accessible in Encounters.   Signed, Charlie Pitter, PA-C  12/08/2018 8:48 AM    [address], [place], [place]  [address] Phone: [phone]; Fax: [phone]

## 2018-12-08 ENCOUNTER — Ambulatory Visit: Payer: No Typology Code available for payment source | Admitting: Physician Assistant

## 2018-12-08 ENCOUNTER — Encounter (INDEPENDENT_AMBULATORY_CARE_PROVIDER_SITE_OTHER): Payer: Self-pay

## 2018-12-08 ENCOUNTER — Other Ambulatory Visit: Payer: Self-pay

## 2018-12-08 ENCOUNTER — Encounter: Payer: Self-pay | Admitting: Physician Assistant

## 2018-12-08 VITALS — BP 130/78 | HR 71 | Ht 70.0 in | Wt 223.4 lb

## 2018-12-08 DIAGNOSIS — I77810 Thoracic aortic ectasia: Secondary | ICD-10-CM

## 2018-12-08 DIAGNOSIS — I2510 Atherosclerotic heart disease of native coronary artery without angina pectoris: Secondary | ICD-10-CM | POA: Diagnosis not present

## 2018-12-08 DIAGNOSIS — Z01810 Encounter for preprocedural cardiovascular examination: Secondary | ICD-10-CM | POA: Diagnosis not present

## 2018-12-08 DIAGNOSIS — I351 Nonrheumatic aortic (valve) insufficiency: Secondary | ICD-10-CM | POA: Diagnosis not present

## 2018-12-08 NOTE — Patient Instructions (Addendum)
Medication Instructions:  Your physician recommends that you continue on your current medications as directed. Please refer to the Current Medication list given to you today.  If you need a refill on your cardiac medications before your next appointment, please call your pharmacy.   Lab work: None ordered  If you have labs (blood work) drawn today and your tests are completely normal, you will receive your results only by: Marland Kitchen MyChart Message (if you have MyChart) OR . A paper copy in the mail If you have any lab test that is abnormal or we need to change your treatment, we will call you to review the results.  Testing/Procedures: Your physician has requested that you have a MRA IN January OF 2021.  Someone will call you to schedule this.  MRA uses a computer to create images of your heart as its beating, producing both still and moving pictures of your heart and major blood vessels. For further information please visit [email or web address] Please follow the instruction sheet given to you today for more information.    Follow-Up: At [facility], you and your health needs are our priority.  As part of our continuing mission to provide you with exceptional heart care, we have created designated Provider Care Teams.  These Care Teams include your primary Cardiologist (physician) and Advanced Practice Providers (APPs -  Physician Assistants and Nurse Practitioners) who all work together to provide you with the care you need, when you need it. You will need a follow up appointment in 9 months.  Please call our office 2 months in advance to schedule this appointment.  You may see Larae Grooms, MD or one of the following Advanced Practice Providers on your designated Care Team:   [place], PA-C Melina Copa, PA-C . Ermalinda Barrios, PA-C  Any Other Special Instructions Will Be Listed Below (If Applicable).  Information About Your Aneurysm  One of your tests has shown an  aneurysm/dilation of your aorta. The word "aneurysm" refers to a bulge in an artery (blood vessel). Most people think of them in the context of an emergency, but yours was found incidentally in the past. At this point there is nothing you need to do from a procedure standpoint, but there are some important things to keep in mind for day-to-day life.  Mainstays of therapy for aneurysms include very good blood pressure control, healthy lifestyle, and avoiding tobacco products and street drugs. Research has raised concern that antibiotics in the fluoroquinolone class could be associated with increased risk of having an aneurysm develop or tear. This includes medicines that end in "floxacin," like Cipro or Levaquin. Make sure to discuss this information with other healthcare providers if you require antibiotics.  Since aneurysms can run in families, you should discuss your diagnosis with first degree relatives as they may need to be screened for this. Regular mild-moderate physical exercise is important, but avoid heavy lifting/weight lifting over 30lbs, chopping wood, shoveling snow or digging heavy earth with a shovel. It is best to avoid activities that cause grunting or straining (medically referred to as a "Valsalva maneuver"). This happens when a person bears down against a closed throat to increase the strength of arm or abdominal muscles. There's often a tendency to do this when lifting heavy weights, doing sit-ups, push-ups or chin-ups, etc., but it may be harmful.  This is a finding I would expect to be monitored periodically by your cardiology team. Most unruptured thoracic aortic aneurysms cause no symptoms, so they are often  found during exams for other conditions. Contact a health care provider if you develop any discomfort in your upper back, neck, abdomen, trouble swallowing, cough or hoarseness, or unexplained weight loss. Get help right away if you develop severe pain in your upper back or  abdomen that may move into your chest and arms, or any other concerning symptoms such as shortness of breath or fever.  Please monitor your blood pressure occaisonally at home at different times during the day over this next week. Please call us or send in a MyChart message with those readings for our review.

## 2018-12-14 ENCOUNTER — Telehealth: Payer: Self-pay | Admitting: *Deleted

## 2018-12-14 NOTE — Telephone Encounter (Signed)
Staff message sent to [facility] received. Ok to schedule sleep study. [place] # [id number]. Valid 12/01/18 to 03/15/19

## 2018-12-18 NOTE — Telephone Encounter (Signed)
RE: precert Lauralee Evener, CMA  Freada Bergeron, CMA        [facility] auth received. Ok to schedule sleep study. Auth # [id number]. Valid dates 12/01/18 to 03/15/19     ----- Message -----  From: Freada Bergeron, CMA  Sent: 12/01/2018  9:56 AM EDT  To: Cv Div Sleep Studies  Subject: precert                      Split night

## 2018-12-18 NOTE — Telephone Encounter (Addendum)
Patient is scheduled for lab study on 12/31/18. Daniel Humphrey is scheduled for COVID screening on 12/28/18 2:40 prior to his sleep study. Patient understands her sleep study will be done at [facility] sleep lab. Patient understands she will receive a sleep packet in a week or so. Patient understands to call if she does not receive the sleep packet in a timely manner.  Left detailed message on voicemail with date and time of SS/COVID TEST and informed patient to call back to confirm or reschedule @ [phone].

## 2018-12-19 ENCOUNTER — Encounter (HOSPITAL_COMMUNITY): Payer: Self-pay | Admitting: Radiology

## 2018-12-21 ENCOUNTER — Other Ambulatory Visit: Payer: Self-pay

## 2018-12-21 ENCOUNTER — Ambulatory Visit (HOSPITAL_COMMUNITY): Payer: No Typology Code available for payment source | Attending: Cardiology

## 2018-12-21 DIAGNOSIS — I351 Nonrheumatic aortic (valve) insufficiency: Secondary | ICD-10-CM | POA: Diagnosis present

## 2018-12-22 ENCOUNTER — Telehealth: Payer: Self-pay

## 2018-12-22 NOTE — Telephone Encounter (Signed)
Pt advised his Echo results and asked if he is cleared for his surgery advised him I will need to talk with Melina Copa PA and call him back.    In reviewing his chart and according to his OV note he still needs his Sleep study prior to having full cardiac clearance. Unclear if pt has had this done yet or not..   LM for pt to call back.

## 2018-12-22 NOTE — Telephone Encounter (Signed)
-----   Message from Charlie Pitter, [place] sent at 12/22/2018  6:21 AM EDT ----- Anderson Malta, please let pt know echo appears similar to the one he had several years ago without significant change - heart pump function remains totally normal, leaking of aortic valve is only trivial at this point (so actually a little better than before). There is mild dilation of the aorta which we are following with a yearly MRA - the measurement corresponds with the CT scan he had earlier this year. There is some stiffness of the heart but not unexpected given his age. Would keep an eye on BP - let us know how home readings have been running per our discussion at last OV. Dayna Dunn PA-C

## 2018-12-22 NOTE — Telephone Encounter (Signed)
LMTCB for his Echo results

## 2018-12-25 NOTE — Telephone Encounter (Signed)
It's my understanding the surgeons are the one who requested the sleep study before proceeding with surgery. Otherwise from a cardiac standpoint he is good to go from my standpoint. Franchon Ketterman PA-C

## 2018-12-25 NOTE — Telephone Encounter (Signed)
Call placed to pt re: clearance for surgery.  Pt is aware that he can't be cleared until his sleep study is completed, which pt is scheduled for 01/04/2019.  Pt was grateful for the call and verbalized understanding.

## 2018-12-28 ENCOUNTER — Other Ambulatory Visit (HOSPITAL_COMMUNITY): Payer: No Typology Code available for payment source

## 2018-12-31 ENCOUNTER — Encounter (HOSPITAL_BASED_OUTPATIENT_CLINIC_OR_DEPARTMENT_OTHER): Payer: No Typology Code available for payment source | Admitting: Cardiology

## 2019-01-01 ENCOUNTER — Other Ambulatory Visit (HOSPITAL_COMMUNITY)
Admission: RE | Admit: 2019-01-01 | Discharge: 2019-01-01 | Disposition: A | Discharge status: Home / Self Care | Payer: No Typology Code available for payment source | Source: Ambulatory Visit | Attending: Cardiology | Admitting: Cardiology

## 2019-01-01 DIAGNOSIS — Z01812 Encounter for preprocedural laboratory examination: Secondary | ICD-10-CM | POA: Insufficient documentation

## 2019-01-01 DIAGNOSIS — Z20828 Contact with and (suspected) exposure to other viral communicable diseases: Secondary | ICD-10-CM | POA: Insufficient documentation

## 2019-01-02 LAB — NOVEL CORONAVIRUS, NAA (HOSP ORDER, SEND-OUT TO REF LAB; TAT 18-24 HRS): SARS-CoV-2, NAA: NOT DETECTED

## 2019-01-04 ENCOUNTER — Ambulatory Visit (HOSPITAL_BASED_OUTPATIENT_CLINIC_OR_DEPARTMENT_OTHER): Payer: No Typology Code available for payment source | Attending: Interventional Cardiology | Admitting: Cardiology

## 2019-01-04 ENCOUNTER — Other Ambulatory Visit: Payer: Self-pay

## 2019-01-04 DIAGNOSIS — I493 Ventricular premature depolarization: Secondary | ICD-10-CM | POA: Insufficient documentation

## 2019-01-04 DIAGNOSIS — G4733 Obstructive sleep apnea (adult) (pediatric): Secondary | ICD-10-CM | POA: Diagnosis not present

## 2019-01-05 ENCOUNTER — Telehealth: Payer: Self-pay | Admitting: *Deleted

## 2019-01-05 NOTE — Telephone Encounter (Signed)
-----   Message from Sueanne Margarita, MD sent at 01/05/2019  4:11 PM EDT ----- Please let patient know that they have sleep apnea and recommend CPAP titration. Please set up titration in the sleep lab.

## 2019-01-05 NOTE — Telephone Encounter (Signed)
Informed patient of sleep study results and patient understanding was verbalized. Patient understands his sleep study showed they have sleep apnea and recommend CPAP titration. Please set up titration in the sleep lab.  Pt is aware and agreeable to his results.  Titration sent to sleep pool   

## 2019-01-05 NOTE — Procedures (Signed)
    Patient Name: Daniel Humphrey, Daniel Humphrey Date: 01/04/2019 Gender: Male D.O.B: [birth year] Age (years): 45 Referring Provider: Fransico Him MD, ABSM Height (inches): 70 Interpreting Physician: Fransico Him MD, ABSM Weight (lbs): 220 RPSGT: [place], [place] BMI: 32 MRN: [id number] Neck Size: 17.00  CLINICAL INFORMATION Sleep Study Type: NPSG  Indication for sleep study: Fatigue, Snoring, Witnesses Apnea / Gasping During Sleep  Epworth Sleepiness Score: 5  Most recent polysomnogram dated 04/01/2015 revealed an AHI of 35.2/h and RDI of 44.3/h. Most recent titration study was on 04/01/2015.  SLEEP STUDY TECHNIQUE As per the AASM Manual for the Scoring of Sleep and Associated Events v2.3 (April 2016) with a hypopnea requiring 4% desaturations.  The channels recorded and monitored were frontal, central and occipital EEG, electrooculogram (EOG), submentalis EMG (chin), nasal and oral airflow, thoracic and abdominal wall motion, anterior tibialis EMG, snore microphone, electrocardiogram, and pulse oximetry.  MEDICATIONS Medications self-administered by patient taken the night of the study : ZYRTEC-D 12 HOUR TABLETS  SLEEP ARCHITECTURE The study was initiated at 10:25:46 PM and ended at 5:15:13 AM.  Sleep onset time was 9.1 minutes and the sleep efficiency was 84.2%. The total sleep time was 344.8 minutes.  Stage REM latency was 346.5 minutes.  The patient spent 13.3% of the night in stage N1 sleep, 82.2% in stage N2 sleep, 0.0% in stage N3 and 4.5% in REM.  Alpha intrusion was absent.  Supine sleep was 55.65%.  RESPIRATORY PARAMETERS The overall apnea/hypopnea index (AHI) was 25.9 per hour. There were 1 total apneas, including 0 obstructive, 1 central and 0 mixed apneas. There were 148 hypopneas and 96 RERAs.  The AHI during Stage REM sleep was 7.7 per hour.  AHI while supine was 41.6 per hour.  The mean oxygen saturation was 92.7%. The minimum SpO2 during sleep was 87.0%.   moderate snoring was noted during this study.  CARDIAC DATA The 2 lead EKG demonstrated sinus rhythm. The mean heart rate was 73.7 beats per minute. Other EKG findings include: PVCs.  LEG MOVEMENT DATA The total PLMS were 0 with a resulting PLMS index of 0.0. Associated arousal with leg movement index was 0.0 .  IMPRESSIONS - Moderate obstructive sleep apnea occurred during this study (AHI = 25.9/h). - No significant central sleep apnea occurred during this study (CAI = 0.2/h). - Mild oxygen desaturation was noted during this study (Min O2 = 87.0%). - The patient snored with moderate snoring volume. - EKG findings include PVCs. - Clinically significant periodic limb movements did not occur during sleep. No significant associated arousals.  DIAGNOSIS - Obstructive Sleep Apnea (327.23 [G47.33 ICD-10])  RECOMMENDATIONS - Therapeutic CPAP titration to determine optimal pressure required to alleviate sleep disordered breathing. - Positional therapy avoiding supine position during sleep. - Avoid alcohol, sedatives and other CNS depressants that may worsen sleep apnea and disrupt normal sleep architecture. - Sleep hygiene should be reviewed to assess factors that may improve sleep quality. - Weight management and regular exercise should be initiated or continued if appropriate.  [Electronically signed] 01/05/2019 04:09 PM  Fransico Him MD, ABSM Diplomate, American Board of Sleep Medicine

## 2019-01-11 ENCOUNTER — Telehealth: Payer: Self-pay | Admitting: *Deleted

## 2019-01-11 NOTE — Telephone Encounter (Signed)
PA submitted to [employer] via web portal for CPAP titration. 

## 2019-01-24 ENCOUNTER — Other Ambulatory Visit: Payer: Self-pay

## 2019-01-24 DIAGNOSIS — Z20822 Contact with and (suspected) exposure to covid-19: Secondary | ICD-10-CM

## 2019-01-26 ENCOUNTER — Telehealth: Payer: Self-pay | Admitting: *Deleted

## 2019-01-26 NOTE — Telephone Encounter (Signed)
Staff message sent to [facility] received call from [facility] denying CPAP titration. MD can do APAP or call [phone] and do peer to peer.

## 2019-01-27 LAB — NOVEL CORONAVIRUS, NAA: SARS-CoV-2, NAA: NOT DETECTED

## 2019-01-29 ENCOUNTER — Telehealth: Payer: Self-pay | Admitting: *Deleted

## 2019-01-29 DIAGNOSIS — G4733 Obstructive sleep apnea (adult) (pediatric): Secondary | ICD-10-CM

## 2019-01-29 NOTE — Telephone Encounter (Signed)
-----   Message from Sueanne Margarita, MD sent at 01/29/2019  2:25 PM EST ----- Regarding: RE: APAP Order ResMed CPAP on auto from 4 to 20cm H2O with heated humidity and mask of choice and get download in 2 weeks  Traci Turner ----- Message ----- From: Freada Bergeron, CMA Sent: 01/29/2019   8:34 AM EST To: Sueanne Margarita, MD Subject: APAP                                           Please write settings. Thanks ----- Message ----- From: Lauralee Evener, CMA Sent: 01/26/2019  10:07 AM EST To: Freada Bergeron, CMA Subject: RE: precert                                    Received a phone call from [facility]. CPAP titration denied. MD can do APAP or call and do peer to peer  @ [phone]. ----- Message ----- From: Freada Bergeron, CMA Sent: 01/05/2019   5:29 PM EST To: Cv Div Sleep Studies Subject: precert                                         recommend CPAP titration.

## 2019-01-29 NOTE — Telephone Encounter (Signed)
Order placed to [facility] 

## 2019-02-28 ENCOUNTER — Telehealth: Payer: Self-pay | Admitting: *Deleted

## 2019-02-28 NOTE — Telephone Encounter (Signed)
Patient has a 10 week follow up appointment scheduled for 04/19/19. Patient understands he needs to keep this appointment for insurance compliance. Patient was grateful for the call and thanked me.

## 2019-02-28 NOTE — Telephone Encounter (Signed)

## 2019-04-18 NOTE — Progress Notes (Signed)
Virtual Visit via Telephone Note   This visit type was conducted due to national recommendations for restrictions regarding the COVID-19 Pandemic (e.g. social distancing) in an effort to limit this patient's exposure and mitigate transmission in our community.  Due to his co-morbid illnesses, this patient is at least at moderate risk for complications without adequate follow up.  This format is felt to be most appropriate for this patient at this time.  All issues noted in this document were discussed and addressed.  A limited physical exam was performed with this format.  Please refer to the patient's chart for his consent to telehealth for [facility].   Evaluation Performed:  Follow-up visit  This visit type was conducted due to national recommendations for restrictions regarding the COVID-19 Pandemic (e.g. social distancing).  This format is felt to be most appropriate for this patient at this time.  All issues noted in this document were discussed and addressed.  No physical exam was performed (except for noted visual exam findings with Video Visits).  Please refer to the patient's chart (MyChart message for video visits and phone note for telephone visits) for the patient's consent to telehealth for [facility].  Date:  04/19/2019   ID:  Daniel Humphrey, DOB 04/07/1955, MRN [id number]  Patient Location:  Home  Provider location:   [place]  PCP:  Shirline Frees, MD  Cardiologist:  Casandra Doffing, MD  Electrophysiologist:  None   Chief Complaint:  OSA  History of Present Illness:    Daniel Humphrey is a 64 y.o. male who presents via audio/video conferencing for a telehealth visit today.    This is a 64yo male with a hx of ASCAD, HLD and OSA.  He had a sleep study a few years ago but never followed up with CPAP therapy. He saw Dr. Irish Lack and told him that he was not sleeping well and feeling tired during the day.  He was set up to have knee surgery and surgery was  delayed until he could get the sleep study done and get on CPAP if needed.  Dr. Irish Lack ordered a sleep study which showed moderate OSA with an AHI of 25.9/hr and no significant central events.  The minimum O2 sat was 87%.  He was started on auto CPAP from 4 to 20cm H2O.    He is doing well with his CPAP device and thinks that he has gotten used to it.  He tolerates the full face mask and feels the pressure is adequate.  Since going on CPAP he feels rested in the am and has no significant daytime sleepiness.  He denies any significant mouth or nasal dryness or nasal congestion.  He does not think that he snores.    The patient does not have symptoms concerning for COVID-19 infection (fever, chills, cough, or new shortness of breath).   Prior CV studies:   The following studies were reviewed today:  PAP compliance download and sleep study  Past Medical History:  Diagnosis Date  . Aortic root dilation (HCC)   . Arthritis    OA- R knee, hands   . ED (erectile dysfunction)   . 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 High cholesterol   . Mild CAD    a. by cardiac CT 08/2018.  . Obesity   . OSA (obstructive sleep apnea) 04/15/2015   Severe OSA with AHI 35.2 and successful CPAP titration to 9cmH2O, needs CPAP, will continue to explore   . Seasonal  allergies    Past Surgical History:  Procedure Laterality Date  . CERVICAL FUSION  1999  . COLON RESECTION Right 07/29/2017   Procedure: POSSIBLE RIGHT COLON RESECTION;  Surgeon: Alphonsa Overall, MD;  Location: [facility];  Service: General;  Laterality: Right;  . COLONOSCOPY WITH PROPOFOL  04/19/2012   Procedure: COLONOSCOPY WITH PROPOFOL;  Surgeon: Jeryl Columbia, MD;  Location: [facility];  Service: Endoscopy;  Laterality: N/A;  . COLONOSCOPY WITH PROPOFOL N/A 01/17/2015   Procedure: COLONOSCOPY WITH PROPOFOL;  Surgeon: Clarene Essex, MD;  Location: [facility];  Service: Endoscopy;  Laterality: N/A;  . HOT HEMOSTASIS  04/19/2012   Procedure: HOT HEMOSTASIS (ARGON  PLASMA COAGULATION/BICAP);  Surgeon: Jeryl Columbia, MD;  Location: [facility];  Service: Endoscopy;  Laterality: N/A;  . HOT HEMOSTASIS N/A 01/17/2015   Procedure: HOT HEMOSTASIS (ARGON PLASMA COAGULATION/BICAP);  Surgeon: Clarene Essex, MD;  Location: [facility];  Service: Endoscopy;  Laterality: N/A;  . IR RADIOLOGIST EVAL & MGMT  05/25/2017  . IR RADIOLOGIST EVAL & MGMT  06/08/2017  . LAPAROSCOPIC APPENDECTOMY N/A 07/29/2017   Procedure: APPENDECTOMY LAPAROSCOPIC ERAS PATHWAY;  Surgeon: Alphonsa Overall, MD;  Location: [facility];  Service: General;  Laterality: N/A;  . NASAL SEPTUM SURGERY  1998     Current Meds  Medication Sig  . aspirin 81 MG tablet Take 81 mg by mouth daily.  . cetirizine-pseudoephedrine (ZYRTEC-D) 5-120 MG per tablet Take 1 tablet by mouth at bedtime.  . fluticasone (FLONASE) 50 MCG/ACT nasal spray Place 1 spray into the nose daily.  Marland Kitchen ibuprofen (ADVIL,MOTRIN) 800 MG tablet Take 1 tablet (800 mg total) by mouth every 8 (eight) hours as needed.  . meloxicam (MOBIC) 15 MG tablet Take 15 mg by mouth daily.  . Multiple Vitamin (MULTIVITAMIN WITH MINERALS) TABS Take 1 tablet by mouth daily.  . simvastatin (ZOCOR) 10 MG tablet Take 10 mg by mouth daily at 6 PM.      Allergies:   Patient has no known allergies.   Social History   Tobacco Use  . Smoking status: Former Smoker    Packs/day: 1.00    Years: 3.00    Pack years: 3.00    Quit date: 07/14/1987    Years since quitting: 31.7  . Smokeless tobacco: Never Used  Substance Use Topics  . Alcohol use: Yes    Comment: rare  . Drug use: No     Family Hx: The patient's family history includes Heart attack in his brother, brother, father, and mother; Heart disease in his brother and brother; Hyperlipidemia in his father and mother; Hypertension in his sister.  ROS:   Please see the history of present illness.     All other systems reviewed and are negative.   Labs/Other Tests and Data Reviewed:    Recent  Labs: 08/18/2018: BUN 20; Creatinine, Ser 1.00; Potassium 4.4; Sodium 139   Recent Lipid Panel No results found for: CHOL, TRIG, HDL, CHOLHDL, LDLCALC, LDLDIRECT  Wt Readings from Last 3 Encounters:  04/19/19 216 lb (98 kg)  01/04/19 220 lb (99.8 kg)  12/08/18 223 lb 6.4 oz (101.3 kg)     Objective:    Vital Signs:  BP 138/78   Wt 216 lb (98 kg)   BMI 30.99 kg/m     ASSESSMENT & PLAN:    1.  OSA - The pathophysiology of obstructive sleep apnea , it's cardiovascular consequences & modes of treatment including CPAP were discused with the patient in detail & they evidenced  understanding.  The patient is tolerating PAP therapy well without any problems. The PAP download was reviewed today and showed an AHI of 2.3/hr on auto PAP with 20% compliance in using more than 4 hours nightly.  The patient has been using and benefiting from PAP use and will continue to benefit from therapy. I have encouraged him to be more compliant with his device.  2.  Obesity -I have encouraged him to get into a routine exercise program and cut back on carbs and portions.   3. Excessive Daytime sleepiness -secondary to OSA -significantly improved with PAP therapy  4.  Elevated BP with no dx of HTN - BP has been elevated at times at the MD office - BP at home has been fine  COVID-19 Education: The signs and symptoms of COVID-19 were discussed with the patient and how to seek care for testing (follow up with PCP or arrange E-visit).  The importance of social distancing was discussed today.  Patient Risk:   After full review of this patient's clinical status, I feel that they are at least moderate risk at this time.  Time:   Today, I have spent 20 minutes directly with the patient on telemedicine discussing medical problems including OSA,, obesity.  We also reviewed the symptoms of COVID 19 and the ways to protect against contracting the virus with telehealth technology.  I spent an additional 5 minutes  reviewing patient's chart including Sleep study, PAP compliance download.  Medication Adjustments/Labs and Tests Ordered: Current medicines are reviewed at length with the patient today.  Concerns regarding medicines are outlined above.  Tests Ordered: No orders of the defined types were placed in this encounter.  Medication Changes: No orders of the defined types were placed in this encounter.   Disposition:  Follow up in 1 year(s)  Signed, Fransico Him, MD  04/19/2019 8:36 AM    [facility]

## 2019-04-19 ENCOUNTER — Other Ambulatory Visit: Payer: Self-pay

## 2019-04-19 ENCOUNTER — Telehealth: Payer: Self-pay | Admitting: *Deleted

## 2019-04-19 ENCOUNTER — Telehealth (INDEPENDENT_AMBULATORY_CARE_PROVIDER_SITE_OTHER): Payer: No Typology Code available for payment source | Admitting: Cardiology

## 2019-04-19 ENCOUNTER — Encounter: Payer: Self-pay | Admitting: Cardiology

## 2019-04-19 VITALS — BP 138/78 | Wt 216.0 lb

## 2019-04-19 DIAGNOSIS — R030 Elevated blood-pressure reading, without diagnosis of hypertension: Secondary | ICD-10-CM | POA: Diagnosis not present

## 2019-04-19 DIAGNOSIS — G4719 Other hypersomnia: Secondary | ICD-10-CM | POA: Diagnosis not present

## 2019-04-19 DIAGNOSIS — G4733 Obstructive sleep apnea (adult) (pediatric): Secondary | ICD-10-CM | POA: Diagnosis not present

## 2019-04-19 DIAGNOSIS — E669 Obesity, unspecified: Secondary | ICD-10-CM

## 2019-04-19 NOTE — Telephone Encounter (Signed)
Informed patient of compliance results and verbalized understanding was indicated. Patient is aware and agreeable to improving in compliance with machine usage.

## 2019-04-19 NOTE — Telephone Encounter (Signed)
-----   Message from Sueanne Margarita, MD sent at 04/19/2019  9:42 AM EST ----- Please let patient know that download looked good but needs to be more compliant with his device.  Followup with me in 1 year

## 2019-04-19 NOTE — Patient Instructions (Signed)
Medication Instructions:  °Your physician recommends that you continue on your current medications as directed. Please refer to the Current Medication list given to you today. ° °*If you need a refill on your cardiac medications before your next appointment, please call your pharmacy* ° °Follow-Up: °At [facility], you and your health needs are our priority.  As part of our continuing mission to provide you with exceptional heart care, we have created designated Provider Care Teams.  These Care Teams include your primary Cardiologist (physician) and Advanced Practice Providers (APPs -  Physician Assistants and Nurse Practitioners) who all work together to provide you with the care you need, when you need it. ° ° °Your next appointment:   °1 year(s) ° °The format for your next appointment:   °Either In Person or Virtual ° °Provider:   °You may see Traci Turner, MD or one of the following Advanced Practice Providers on your designated Care Team:   °· Dayna Dunn, PA-C °· Michele Lenze, PA-C ° ° °

## 2019-05-07 ENCOUNTER — Telehealth: Payer: Self-pay | Admitting: Cardiology

## 2019-05-07 NOTE — Telephone Encounter (Signed)
New message:    Patient calling stating that he need to speak with some one concering his CPAP machine. Please call patient.

## 2019-05-08 NOTE — Telephone Encounter (Signed)
04/19/19 appt with Dr Radford Pax, patient waiting for a surgical clearance to be sent to [facility].. Please call patient.

## 2019-08-17 ENCOUNTER — Other Ambulatory Visit: Payer: Self-pay | Admitting: *Deleted

## 2019-08-17 ENCOUNTER — Other Ambulatory Visit: Payer: No Typology Code available for payment source | Admitting: *Deleted

## 2019-08-17 ENCOUNTER — Other Ambulatory Visit: Payer: Self-pay

## 2019-08-17 DIAGNOSIS — I77810 Thoracic aortic ectasia: Secondary | ICD-10-CM

## 2019-08-17 LAB — BASIC METABOLIC PANEL
BUN/Creatinine Ratio: 26 — ABNORMAL HIGH (ref 10–24)
BUN: 26 mg/dL (ref 8–27)
CO2: 23 mmol/L (ref 20–29)
Calcium: 9.2 mg/dL (ref 8.6–10.2)
Chloride: 102 mmol/L (ref 96–106)
Creatinine, Ser: 1.01 mg/dL (ref 0.76–1.27)
GFR calc Af Amer: 92 mL/min/{1.73_m2} (ref >59)
GFR calc non Af Amer: 79 mL/min/{1.73_m2} (ref >59)
Glucose: 104 mg/dL — ABNORMAL HIGH (ref 65–99)
Potassium: 4.8 mmol/L (ref 3.5–5.2)
Sodium: 139 mmol/L (ref 134–144)

## 2019-09-03 ENCOUNTER — Other Ambulatory Visit: Payer: Self-pay

## 2019-09-03 ENCOUNTER — Ambulatory Visit (INDEPENDENT_AMBULATORY_CARE_PROVIDER_SITE_OTHER)
Admission: RE | Admit: 2019-09-03 | Discharge: 2019-09-03 | Disposition: A | Discharge status: Home / Self Care | Payer: No Typology Code available for payment source | Source: Ambulatory Visit | Attending: Interventional Cardiology | Admitting: Interventional Cardiology

## 2019-09-03 DIAGNOSIS — I77810 Thoracic aortic ectasia: Secondary | ICD-10-CM

## 2019-09-03 MED ORDER — IOHEXOL 350 MG/ML SOLN
100.0000 mL | Freq: Once | INTRAVENOUS | Status: AC | PRN
  Administered 2019-09-03: 100 mL via INTRAVENOUS

## 2019-09-05 NOTE — Progress Notes (Signed)
Cardiology Office Note   Date:  09/07/2019   ID:  Kaidon, Kinker 1955-08-08, MRN [id number]  PCP:  Shirline Frees, MD    No chief complaint on file.  Dilated ascending aorta  Wt Readings from Last 3 Encounters:  09/07/19 228 lb 1.9 oz (103.5 kg)  04/19/19 216 lb (98 kg)  01/04/19 220 lb (99.8 kg)       History of Present Illness: Daniel Humphrey is a 64 y.o. male  who has had a strong family h/o CAD. Three older brothers with stents or CABG or MI. He also has been followed for a thoracic aortic aneurysm.  Negative nuclear study in 10/14.  He had a sleep study showing sleep apnea several years ago, but never followed up with CPAP therapy.  He never had knee surgery in 2019.  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  THis was done at [facility].  No cardiac issues.   Coronary CTA in 2020 showed: "Coronary calcium score of 38. This was 69 percentile for age and sex matched control.  2. Normal coronary origin with right dominance.  3. Minimal non-obstructive CAD. Risk factor modifications are recommended.  4. Mild ascending aortic aneurysm with maximum diameter 42 x 41 mm. Annual follow up with CTA or MRA is recommended."  June 2021 scan showed: "Minor aneurysmal dilatation of the ascending thoracic aorta, maximal diameter 4.2 cm. Sinotubular junction measures 3.4 cm. Sinus of Valsalva measures 4.3 cm. Transverse aorta and descending thoracic aorta are normal in caliber. Patent 3 vessel arch anatomy with mild tortuosity evident. No significant interval change. No focal wall thickening, dissection, mediastinal hemorrhage, or hematoma. Normal heart size. 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    Denies : Chest pain. Dizziness. Leg edema. Nitroglycerin use. Orthopnea. Palpitations. Paroxysmal nocturnal dyspnea.  Shortness of breath. Syncope.   CHecks BP at home.  149 systolic at the lowest.  Can have higher readings if he is active.    Received his COVID vaccines.  [employer].   Still walking.  No cardiac symptoms with activity.    Past Medical History:  Diagnosis Date  . Aortic root dilation (HCC)   . Arthritis    OA- R knee, hands   . ED (erectile dysfunction)   . 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 High cholesterol   . Mild CAD    a. by cardiac CT 08/2018.  . Obesity   . OSA (obstructive sleep apnea) 04/15/2015   Severe OSA with AHI 35.2 and successful CPAP titration to 9cmH2O, needs CPAP, will continue to explore   . Seasonal allergies     Past Surgical History:  Procedure Laterality Date  . CERVICAL FUSION  1999  . COLON RESECTION Right 07/29/2017   Procedure: POSSIBLE RIGHT COLON RESECTION;  Surgeon: Alphonsa Overall, MD;  Location: [facility];  Service: General;  Laterality: Right;  . COLONOSCOPY WITH PROPOFOL  04/19/2012   Procedure: COLONOSCOPY WITH PROPOFOL;  Surgeon: Jeryl Columbia, MD;  Location: [facility];  Service: Endoscopy;  Laterality: N/A;  . COLONOSCOPY WITH PROPOFOL N/A 01/17/2015   Procedure: COLONOSCOPY WITH PROPOFOL;  Surgeon: Clarene Essex, MD;  Location: [facility];  Service: Endoscopy;  Laterality: N/A;  . HOT HEMOSTASIS  04/19/2012   Procedure: HOT HEMOSTASIS (ARGON PLASMA COAGULATION/BICAP);  Surgeon: Jeryl Columbia, MD;  Location: [facility];  Service: Endoscopy;  Laterality: N/A;  . HOT HEMOSTASIS N/A 01/17/2015   Procedure: HOT HEMOSTASIS (ARGON PLASMA COAGULATION/BICAP);  Surgeon: Clarene Essex, MD;  Location: [facility];  Service: Endoscopy;  Laterality: N/A;  . IR RADIOLOGIST EVAL & MGMT  05/25/2017  . IR RADIOLOGIST EVAL & MGMT  06/08/2017  . LAPAROSCOPIC APPENDECTOMY N/A 07/29/2017   Procedure: APPENDECTOMY LAPAROSCOPIC ERAS PATHWAY;  Surgeon: Alphonsa Overall, MD;  Location: [facility];  Service: General;  Laterality: N/A;  . NASAL SEPTUM SURGERY  1998     Current Outpatient  Medications  Medication Sig Dispense Refill  . aspirin 81 MG tablet Take 81 mg by mouth daily.    . carvedilol (COREG) 3.125 MG tablet Take 3.125 mg by mouth 2 (two) times daily.    . cetirizine-pseudoephedrine (ZYRTEC-D) 5-120 MG per tablet Take 1 tablet by mouth at bedtime.    . fluticasone (FLONASE) 50 MCG/ACT nasal spray Place 1 spray into the nose daily.    Marland Kitchen ibuprofen (ADVIL,MOTRIN) 800 MG tablet Take 1 tablet (800 mg total) by mouth every 8 (eight) hours as needed. 30 tablet 0  . meloxicam (MOBIC) 15 MG tablet Take 15 mg by mouth daily.    . Multiple Vitamin (MULTIVITAMIN WITH MINERALS) TABS Take 1 tablet by mouth daily.    . simvastatin (ZOCOR) 10 MG tablet Take 10 mg by mouth daily at 6 PM.      No current facility-administered medications for this visit.    Allergies:   Patient has no known allergies.    Social History:  The patient  reports that he quit smoking about 32 years ago. He has a 3.00 pack-year smoking history. He has never used smokeless tobacco. He reports current alcohol use. He reports that he does not use drugs.   Family History:  The patient's family history includes Heart attack in his brother, brother, father, and mother; Heart disease in his brother and brother; Hyperlipidemia in his father and mother; Hypertension in his sister.    ROS:  Please see the history of present illness.   Otherwise, review of systems are positive for knee pain.   All other systems are reviewed and negative.    PHYSICAL EXAM: VS:  BP (!) 152/80   Pulse 95   Ht 5\' 10"  (1.778 m)   Wt 228 lb 1.9 oz (103.5 kg)   SpO2 96%   BMI 32.73 kg/m  , BMI Body mass index is 32.73 kg/m. GEN: Well nourished, well developed, in no acute distress  HEENT: normal  Neck: no JVD, carotid bruits, or masses Cardiac: RRR; no murmurs, rubs, or gallops,no edema  Respiratory:  clear to auscultation bilaterally, normal work of breathing GI: soft, nontender, nondistended, + BS MS: no deformity or  atrophy  Skin: warm and dry, no rash Neuro:  Strength and sensation are intact Psych: euthymic mood, full affect   EKG:   The ekg ordered 11/2018 demonstrates NSR, no ST changes   Recent Labs: 08/17/2019: BUN 26; Creatinine, Ser 1.01; Potassium 4.8; Sodium 139   Lipid Panel No results found for: CHOL, TRIG, HDL, CHOLHDL, VLDL, LDLCALC, LDLDIRECT   Other studies Reviewed: Additional studies/ records that were reviewed today with results demonstrating: labs reviewed.   ASSESSMENT AND PLAN:  1. Dilated ascending aorta: Stable in 2021.  Low calcium score on prior CT. F/u MRA in 2022 to check aorta. 2. HTN: Increase Coreg to 6.25 mg BID for better BP control.   3. Mild CAD: No angina.  Continue preventive therapy.  4. Hyperlipidemia: LDL 105 in 11/2018.  Whole food, plant based diet. 5. Preoperative eval: needs TKR.  No further cardiac testing needed before knee surgery.     Current medicines are reviewed at length with the patient today.  The patient concerns regarding his medicines were addressed.  The following changes have been made:  No change  Labs/ tests ordered today include:  No orders of the defined types were placed in this encounter.   Recommend 150 minutes/week of aerobic exercise Low fat, low carb, high fiber diet recommended  Disposition:   FU in 1 year   Signed, Larae Grooms, MD  09/07/2019 11:59 AM    [address], [place], [place]  [address] Phone: [phone]; Fax: [phone]

## 2019-09-07 ENCOUNTER — Encounter: Payer: Self-pay | Admitting: Interventional Cardiology

## 2019-09-07 ENCOUNTER — Ambulatory Visit (INDEPENDENT_AMBULATORY_CARE_PROVIDER_SITE_OTHER): Payer: No Typology Code available for payment source | Admitting: Interventional Cardiology

## 2019-09-07 ENCOUNTER — Other Ambulatory Visit: Payer: Self-pay

## 2019-09-07 VITALS — BP 152/80 | HR 95 | Ht 70.0 in | Wt 228.1 lb

## 2019-09-07 DIAGNOSIS — I712 Thoracic aortic aneurysm, without rupture, unspecified: Secondary | ICD-10-CM

## 2019-09-07 DIAGNOSIS — E785 Hyperlipidemia, unspecified: Secondary | ICD-10-CM | POA: Diagnosis not present

## 2019-09-07 DIAGNOSIS — I351 Nonrheumatic aortic (valve) insufficiency: Secondary | ICD-10-CM

## 2019-09-07 DIAGNOSIS — I77810 Thoracic aortic ectasia: Secondary | ICD-10-CM | POA: Diagnosis not present

## 2019-09-07 MED ORDER — CARVEDILOL 6.25 MG PO TABS: 6.2500 mg | ORAL_TABLET | Freq: Two times a day (BID) | ORAL | 3 refills | Status: DC

## 2019-09-07 NOTE — Patient Instructions (Signed)
Medication Instructions:  Your physician has recommended you make the following change in your medication:   INCREASE: carvedilol (coreg) to 6.25 mg by mouth twice a day  *If you need a refill on your cardiac medications before your next appointment, please call your pharmacy*   Lab Work: None today If you have labs (blood work) drawn today and your tests are completely normal, you will receive your results only by: Marland Kitchen MyChart Message (if you have MyChart) OR . A paper copy in the mail If you have any lab test that is abnormal or we need to change your treatment, we will call you to review the results.   Testing/Procedures: Your physician recommends that you have a MRA of the Chest to evaluate your aorta in June 2022    Follow-Up: At [facility], you and your health needs are our priority.  As part of our continuing mission to provide you with exceptional heart care, we have created designated Provider Care Teams.  These Care Teams include your primary Cardiologist (physician) and Advanced Practice Providers (APPs -  Physician Assistants and Nurse Practitioners) who all work together to provide you with the care you need, when you need it.  We recommend signing up for the patient portal called "MyChart".  Sign up information is provided on this After Visit Summary.  MyChart is used to connect with patients for Virtual Visits (Telemedicine).  Patients are able to view lab/test results, encounter notes, upcoming appointments, etc.  Non-urgent messages can be sent to your provider as well.   To learn more about what you can do with MyChart, go to [email or web address].    Your next appointment:   12 month(s)  The format for your next appointment:   In Person  Provider:   You may see Casandra Doffing, MD or one of the following Advanced Practice Providers on your designated Care Team:    Melina Copa, PA-C  Ermalinda Barrios, PA-C    Other Instructions  High-Fiber Diet Fiber, also  called dietary fiber, is a type of carbohydrate that is found in fruits, vegetables, whole grains, and beans. A high-fiber diet can have many health benefits. Your health care provider may recommend a high-fiber diet to help:  Prevent constipation. Fiber can make your bowel movements more regular.  Lower your cholesterol.  Relieve the following conditions: ? Swelling of veins in the anus (hemorrhoids). ? Swelling and irritation (inflammation) of specific areas of the digestive tract (uncomplicated diverticulosis). ? A problem of the large intestine (colon) that sometimes causes pain and diarrhea (irritable bowel syndrome, IBS).  Prevent overeating as part of a weight-loss plan.  Prevent heart disease, type 2 diabetes, and certain cancers. What is my plan? The recommended daily fiber intake in grams (g) includes:  38 g for men age 66 or younger.  30 g for men over age 70.  59 g for women age 30 or younger.  21 g for women over age 72. You can get the recommended daily intake of dietary fiber by:  Eating a variety of fruits, vegetables, grains, and beans.  Taking a fiber supplement, if it is not possible to get enough fiber through your diet. What do I need to know about a high-fiber diet?  It is better to get fiber through food sources rather than from fiber supplements. There is not a lot of research about how effective supplements are.  Always check the fiber content on the nutrition facts label of any prepackaged food. Look for foods  that contain 5 g of fiber or more per serving.  Talk with a diet and nutrition specialist (dietitian) if you have questions about specific foods that are recommended or not recommended for your medical condition, especially if those foods are not listed below.  Gradually increase how much fiber you consume. If you increase your intake of dietary fiber too quickly, you may have bloating, cramping, or gas.  Drink plenty of water. Water helps you to  digest fiber. What are tips for following this plan?  Eat a wide variety of high-fiber foods.  Make sure that half of the grains that you eat each day are whole grains.  Eat breads and cereals that are made with whole-grain flour instead of refined flour or white flour.  Eat brown rice, bulgur wheat, or millet instead of white rice.  Start the day with a breakfast that is high in fiber, such as a cereal that contains 5 g of fiber or more per serving.  Use beans in place of meat in soups, salads, and pasta dishes.  Eat high-fiber snacks, such as berries, raw vegetables, nuts, and popcorn.  Choose whole fruits and vegetables instead of processed forms like juice or sauce. What foods can I eat?  Fruits Berries. Pears. Apples. Oranges. Avocado. Prunes and raisins. Dried figs. Vegetables Sweet potatoes. Spinach. Kale. Artichokes. Cabbage. Broccoli. Cauliflower. Green peas. Carrots. Squash. Grains Whole-grain breads. Multigrain cereal. Oats and oatmeal. Brown rice. Barley. Bulgur wheat. Cotton. Quinoa. Bran muffins. Popcorn. Rye wafer crackers. Meats and other proteins Navy, kidney, and pinto beans. Soybeans. Split peas. Lentils. Nuts and seeds. Dairy Fiber-fortified yogurt. Beverages Fiber-fortified soy milk. Fiber-fortified orange juice. Other foods Fiber bars. The items listed above may not be a complete list of recommended foods and beverages. Contact a dietitian for more options. What foods are not recommended? Fruits Fruit juice. Cooked, strained fruit. Vegetables Fried potatoes. Canned vegetables. Well-cooked vegetables. Grains White bread. Pasta made with refined flour. White rice. Meats and other proteins Fatty cuts of meat. Fried chicken or fried fish. Dairy Milk. Yogurt. Cream cheese. Sour cream. Fats and oils Butters. Beverages Soft drinks. Other foods Cakes and pastries. The items listed above may not be a complete list of foods and beverages to avoid.  Contact a dietitian for more information. Summary  Fiber is a type of carbohydrate. It is found in fruits, vegetables, whole grains, and beans.  There are many health benefits of eating a high-fiber diet, such as preventing constipation, lowering blood cholesterol, helping with weight loss, and reducing your risk of heart disease, diabetes, and certain cancers.  Gradually increase your intake of fiber. Increasing too fast can result in cramping, bloating, and gas. Drink plenty of water while you increase your fiber.  The best sources of fiber include whole fruits and vegetables, whole grains, nuts, seeds, and beans. This information is not intended to replace advice given to you by your health care provider. Make sure you discuss any questions you have with your health care provider. Document Revised: 01/03/2017 Document Reviewed: 01/03/2017 Elsevier Patient Education  2020 [employer].

## 2019-12-03 IMAGING — CT CT ABD-PELV W/ CM
2 of 5 series · 16 of 46 positions shown, 18 images · IV contrast (iopamidol)
Comparison: None.

CLINICAL DATA: 61-year-old male with right lower quadrant abdominal
pain.

EXAM:
CT ABDOMEN AND PELVIS WITH CONTRAST
TECHNIQUE: Multidetector CT imaging of the abdomen and pelvis was performed
using the standard protocol following bolus administration of
intravenous contrast.
CONTRAST:  100mL IW2JTC-MRR IOPAMIDOL (IW2JTC-MRR) INJECTION 61%

[Series 2: axial st · axial · 0.91mm/px · z∈[-529,-84]mm · 13 of 101 slices shown, 15 images]
[im 6/101  soft-tissue]
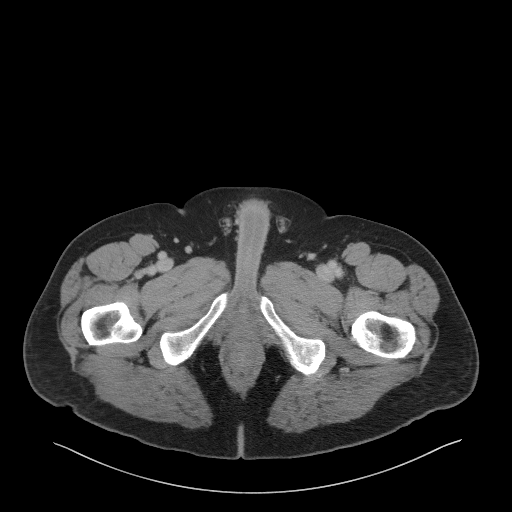
[im 6/101  bone]
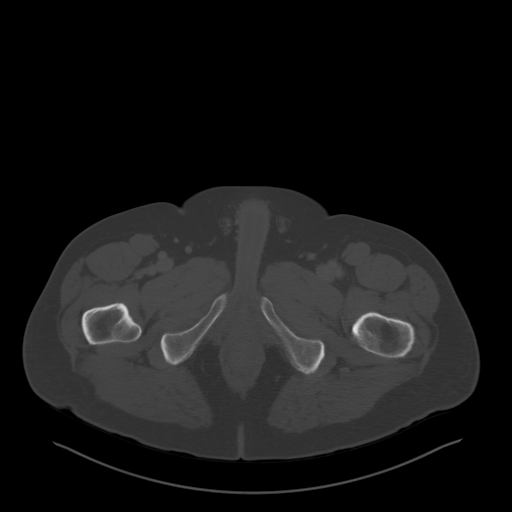
[im 16/101  soft-tissue]
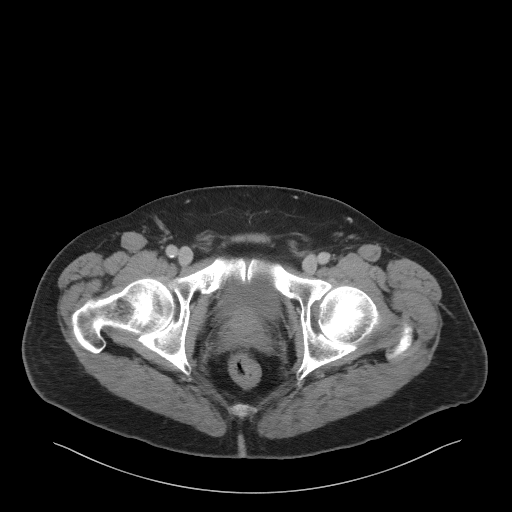
[im 22/101  soft-tissue]
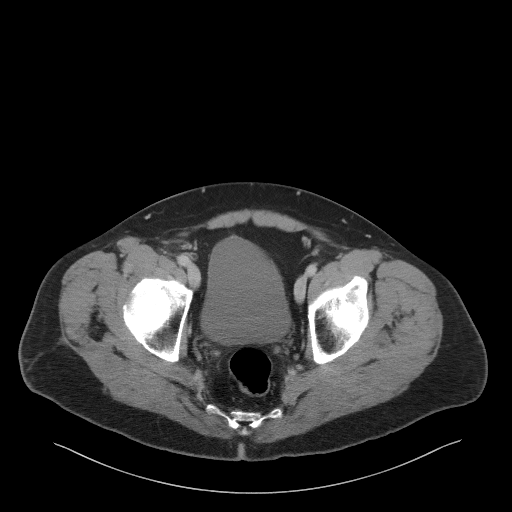
[im 27/101  soft-tissue]
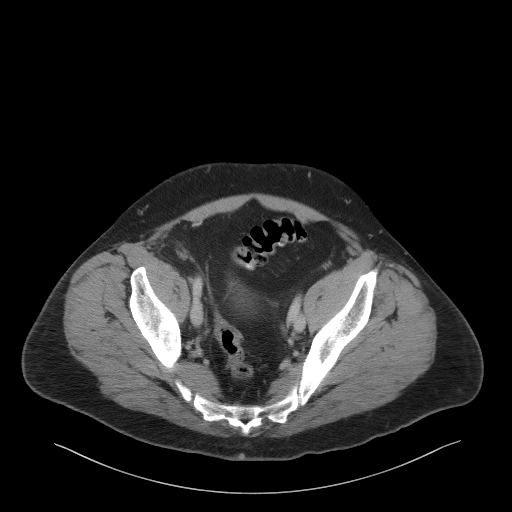
[im 37/101  soft-tissue]
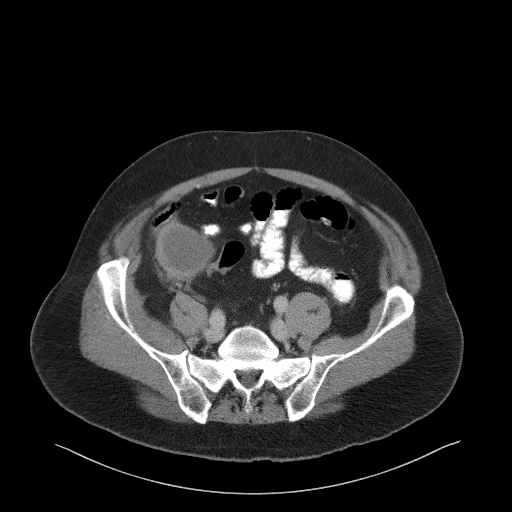
[im 43/101  soft-tissue]
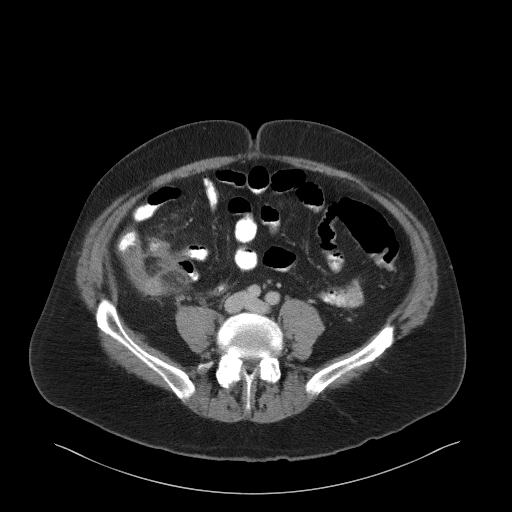
[im 53/101  soft-tissue]
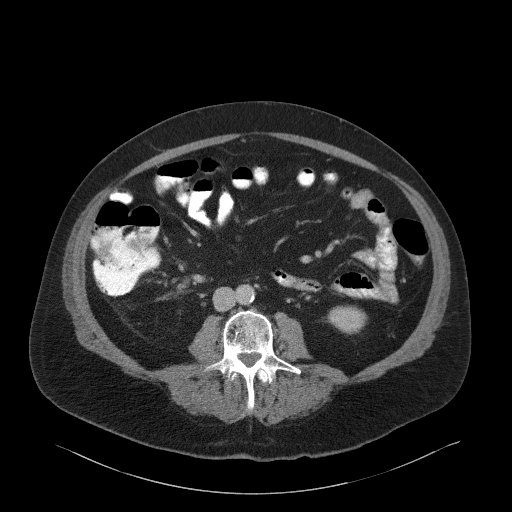
[im 58/101  soft-tissue]
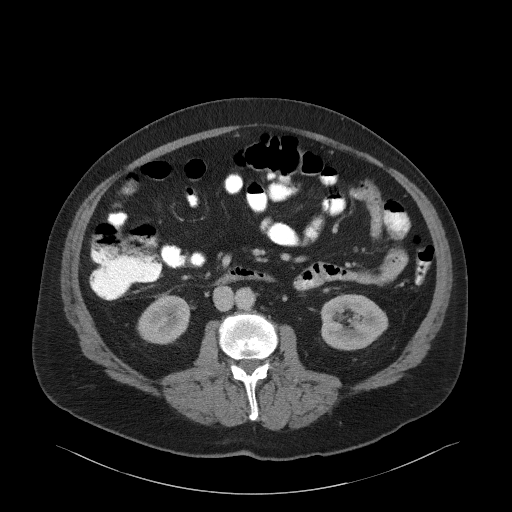
[im 64/101  soft-tissue]
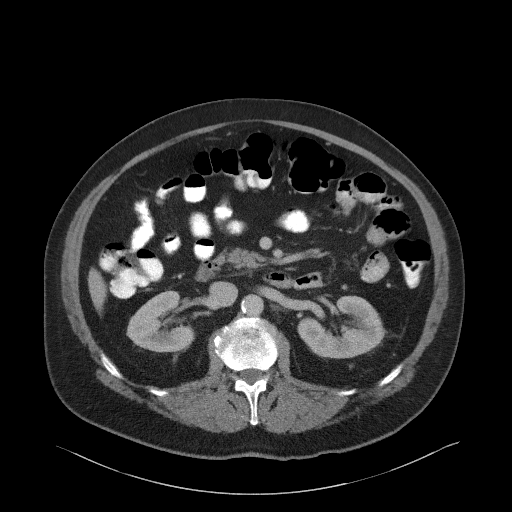
[im 64/101  bone]
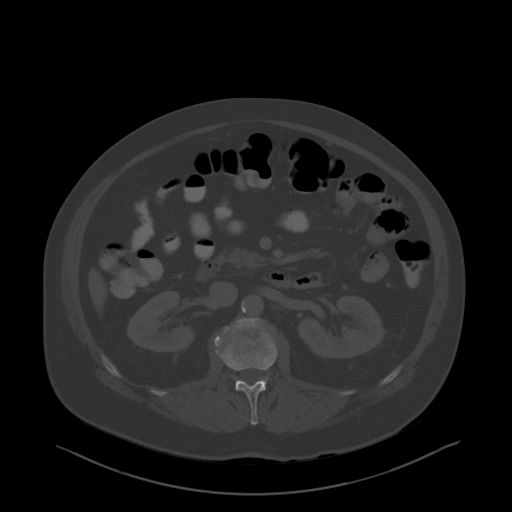
[im 74/101  soft-tissue]
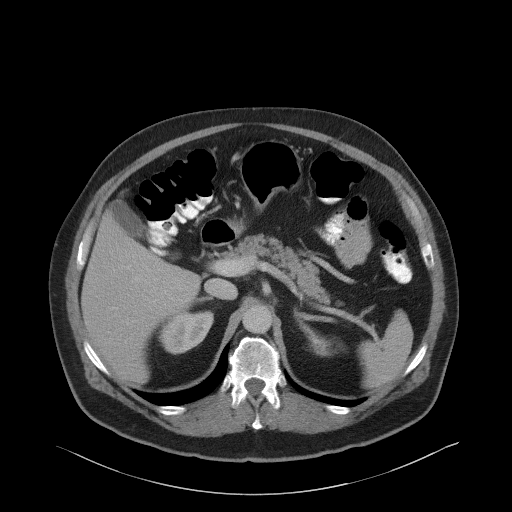
[im 79/101  soft-tissue]
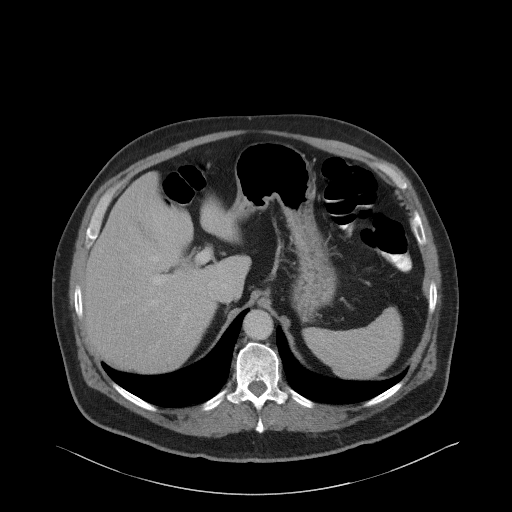
[im 85/101  soft-tissue]
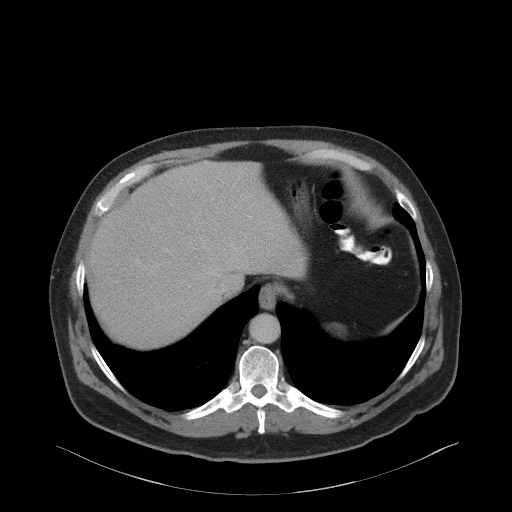
[im 95/101  soft-tissue]
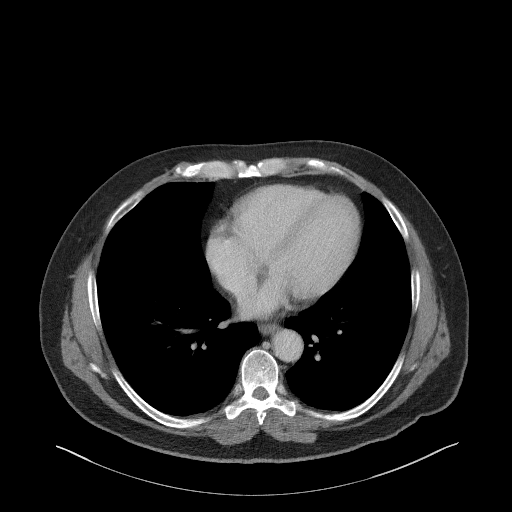

[Series 5: coronal st · coronal · 0.78mm/px · 3 of 108 slices shown]
[im 36/108  soft-tissue]
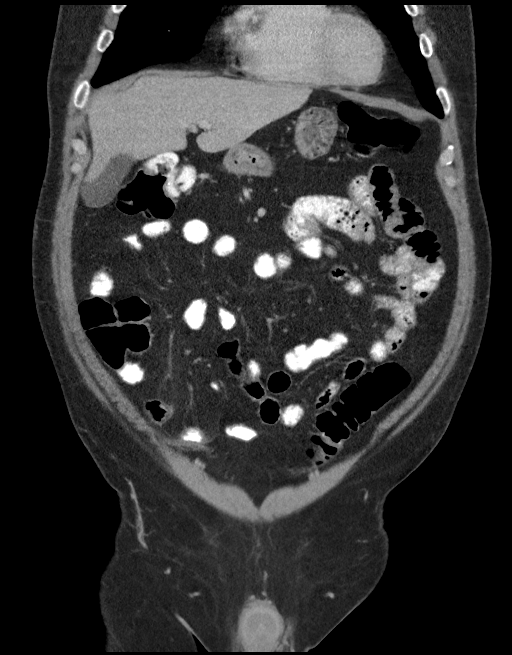
[im 48/108  soft-tissue]
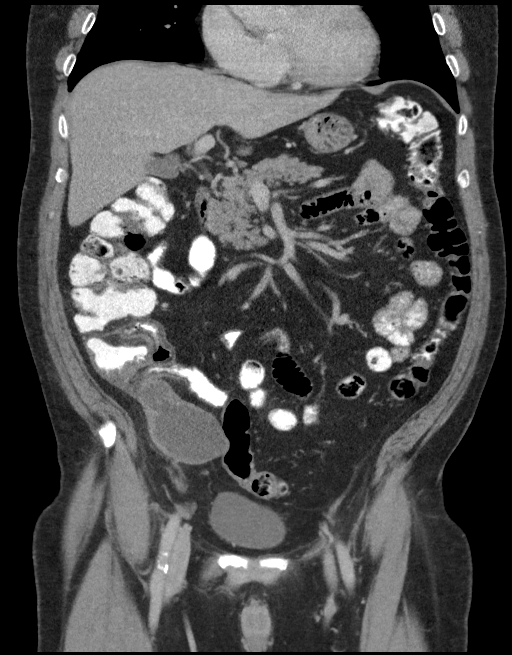
[im 60/108  soft-tissue]
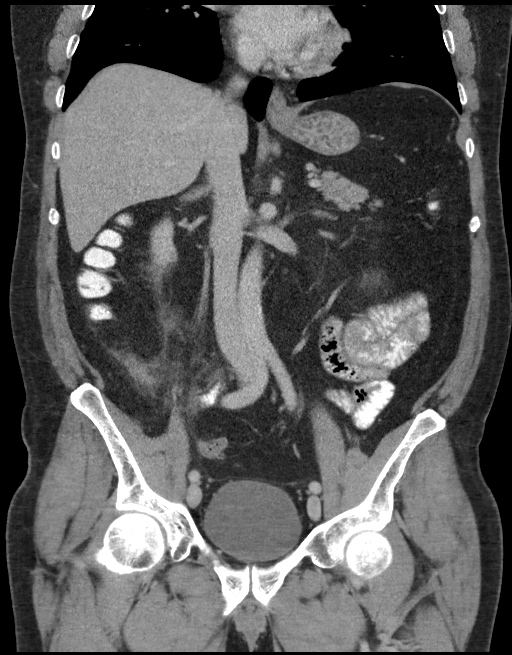

[16 of 46 positions shown; findings below may reference images not displayed]

FINDINGS: Lower chest: The visualized lung bases are clear.

No intra-abdominal free air or free fluid.

Hepatobiliary: No focal liver abnormality is seen. No gallstones,
gallbladder wall thickening, or biliary dilatation.

Pancreas: Unremarkable. No pancreatic ductal dilatation or
surrounding inflammatory changes.

Spleen: Normal in size without focal abnormality.

Adrenals/Urinary Tract: Adrenal glands are unremarkable. Kidneys are
normal, without renal calculi, focal lesion, or hydronephrosis.
Bladder is unremarkable.

Stomach/Bowel: There is no bowel obstruction. There is inflammatory
changes and thickening of the appendix extending to the base of the
cecum. There is a 5.7 x 4.8 x 6.5 cm fluid collection in the tip of
the appendix which may represent an abscess or a loculated
perforation. The appendix is located the right lower quadrant
inferior to the cecum and extending into the right hemipelvis. The
appendix measures approximately 2 cm in thickness.

Vascular/Lymphatic: Mild aortoiliac atherosclerotic disease. The IVC
appears unremarkable. No portal venous gas. There is no adenopathy.

Reproductive: The prostate and seminal vesicles are grossly
unremarkable.

Other: None

Musculoskeletal: Mild degenerative changes of the spine. No acute
osseous pathology. Probable L3 hemangioma.
IMPRESSION: Acute appendicitis with appendiceal abscess or loculated
perforation.

These results were called by telephone at the time of interpretation
on 05/09/2017 at [DATE] to Dr. INDU SEIFERT , who verbally
acknowledged these results.

## 2020-04-17 ENCOUNTER — Telehealth: Payer: No Typology Code available for payment source | Admitting: Cardiology

## 2020-04-22 ENCOUNTER — Telehealth: Payer: No Typology Code available for payment source | Admitting: Cardiology

## 2020-04-22 ENCOUNTER — Other Ambulatory Visit: Payer: Self-pay

## 2020-05-01 NOTE — Progress Notes (Signed)
This encounter was created in error - please disregard.

## 2020-05-02 ENCOUNTER — Encounter: Payer: No Typology Code available for payment source | Admitting: Cardiology

## 2020-05-02 ENCOUNTER — Other Ambulatory Visit: Payer: Self-pay

## 2020-05-02 DIAGNOSIS — G4733 Obstructive sleep apnea (adult) (pediatric): Secondary | ICD-10-CM

## 2020-05-02 DIAGNOSIS — E669 Obesity, unspecified: Secondary | ICD-10-CM

## 2020-05-02 DIAGNOSIS — G4719 Other hypersomnia: Secondary | ICD-10-CM

## 2020-05-06 ENCOUNTER — Telehealth (INDEPENDENT_AMBULATORY_CARE_PROVIDER_SITE_OTHER): Payer: No Typology Code available for payment source | Admitting: Cardiology

## 2020-05-06 ENCOUNTER — Other Ambulatory Visit: Payer: Self-pay

## 2020-05-06 ENCOUNTER — Encounter: Payer: Self-pay | Admitting: Cardiology

## 2020-05-06 VITALS — BP 152/78 | Ht 70.0 in | Wt 230.0 lb

## 2020-05-06 DIAGNOSIS — E669 Obesity, unspecified: Secondary | ICD-10-CM | POA: Diagnosis not present

## 2020-05-06 DIAGNOSIS — R030 Elevated blood-pressure reading, without diagnosis of hypertension: Secondary | ICD-10-CM

## 2020-05-06 DIAGNOSIS — G4733 Obstructive sleep apnea (adult) (pediatric): Secondary | ICD-10-CM | POA: Diagnosis not present

## 2020-05-06 NOTE — Patient Instructions (Signed)
Medication Instructions:  Your physician recommends that you continue on your current medications as directed. Please refer to the Current Medication list given to you today. *If you need a refill on your cardiac medications before your next appointment, please call your pharmacy*   Lab Work: none If you have labs (blood work) drawn today and your tests are completely normal, you will receive your results only by: Marland Kitchen MyChart Message (if you have MyChart) OR . A paper copy in the mail If you have any lab test that is abnormal or we need to change your treatment, we will call you to review the results.   Testing/Procedures: none   Follow-Up: At [facility], you and your health needs are our priority.  As part of our continuing mission to provide you with exceptional heart care, we have created designated Provider Care Teams.  These Care Teams include your primary Cardiologist (physician) and Advanced Practice Providers (APPs -  Physician Assistants and Nurse Practitioners) who all work together to provide you with the care you need, when you need it.   Your next appointment:   1 year(s)  The format for your next appointment:   Virtual Visit   Provider:  Dr. Fransico Him

## 2020-05-06 NOTE — Progress Notes (Signed)
Virtual Visit via Telephone Note   This visit type was conducted due to national recommendations for restrictions regarding the COVID-19 Pandemic (e.g. social distancing) in an effort to limit this patient's exposure and mitigate transmission in our community.  Due to his co-morbid illnesses, this patient is at least at moderate risk for complications without adequate follow up.  This format is felt to be most appropriate for this patient at this time.  All issues noted in this document were discussed and addressed.  A limited physical exam was performed with this format.  Please refer to the patient's chart for his consent to telehealth for [facility].   Evaluation Performed:  Follow-up visit  This visit type was conducted due to national recommendations for restrictions regarding the COVID-19 Pandemic (e.g. social distancing).  This format is felt to be most appropriate for this patient at this time.  All issues noted in this document were discussed and addressed.  No physical exam was performed (except for noted visual exam findings with Video Visits).  Please refer to the patient's chart (MyChart message for video visits and phone note for telephone visits) for the patient's consent to telehealth for [facility].  Date:  05/06/2020   ID:  Daniel Humphrey 1955/03/28, MRN [id number]  Patient Location:  Home  Provider location:   [place]  PCP:  Shirline Frees, MD  Cardiologist:  Casandra Doffing, MD  Electrophysiologist:  None   Chief Complaint:  OSA  History of Present Illness:    Daniel Humphrey is a 65 y.o. male  with a hx of ASCAD, HLD and OSA.  He had a sleep study a few years ago but never followed up with CPAP therapy. He saw Dr. Irish Lack and told him that he was not sleeping well and feeling tired during the day.  He was set up to have knee surgery and surgery was delayed until he could get the sleep study done and get on CPAP if needed.  Dr. Irish Lack ordered a  sleep study which showed moderate OSA with an AHI of 25.9/hr and no significant central events.  The minimum O2 sat was 87%.  He was started on auto CPAP from 4 to 20cm H2O.    He is doing well with his CPAP device and thinks that he has gotten used to it.  He tolerates the mask and feels the pressure is adequate. Unfortunately he stopped using the device recently due to the mask waking him up leaking.  He has never changed the cushions on his mask.  He wants to try a nasal mask instead.  He says that he is not a mouth breather.   The patient does not have symptoms concerning for COVID-19 infection (fever, chills, cough, or new shortness of breath).   Prior CV studies:   The following studies were reviewed today:  PAP compliance download and sleep study  Past Medical History:  Diagnosis Date  . Aortic root dilation (HCC)   . Arthritis    OA- R knee, hands   . ED (erectile dysfunction)   . 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 High cholesterol   . Mild CAD    a. by cardiac CT 08/2018.  . Obesity   . OSA (obstructive sleep apnea) 04/15/2015   Severe OSA with AHI 35.2 and successful CPAP titration to 9cmH2O, needs CPAP, will continue to explore   . Seasonal allergies    Past Surgical History:  Procedure Laterality Date  .  CERVICAL FUSION  1999  . COLON RESECTION Right 07/29/2017   Procedure: POSSIBLE RIGHT COLON RESECTION;  Surgeon: Alphonsa Overall, MD;  Location: [facility];  Service: General;  Laterality: Right;  . COLONOSCOPY WITH PROPOFOL  04/19/2012   Procedure: COLONOSCOPY WITH PROPOFOL;  Surgeon: Jeryl Columbia, MD;  Location: [facility];  Service: Endoscopy;  Laterality: N/A;  . COLONOSCOPY WITH PROPOFOL N/A 01/17/2015   Procedure: COLONOSCOPY WITH PROPOFOL;  Surgeon: Clarene Essex, MD;  Location: [facility];  Service: Endoscopy;  Laterality: N/A;  . HOT HEMOSTASIS  04/19/2012   Procedure: HOT HEMOSTASIS (ARGON PLASMA COAGULATION/BICAP);  Surgeon: Jeryl Columbia, MD;  Location: [facility];  Service:  Endoscopy;  Laterality: N/A;  . HOT HEMOSTASIS N/A 01/17/2015   Procedure: HOT HEMOSTASIS (ARGON PLASMA COAGULATION/BICAP);  Surgeon: Clarene Essex, MD;  Location: [facility];  Service: Endoscopy;  Laterality: N/A;  . IR RADIOLOGIST EVAL & MGMT  05/25/2017  . IR RADIOLOGIST EVAL & MGMT  06/08/2017  . LAPAROSCOPIC APPENDECTOMY N/A 07/29/2017   Procedure: APPENDECTOMY LAPAROSCOPIC ERAS PATHWAY;  Surgeon: Alphonsa Overall, MD;  Location: [facility];  Service: General;  Laterality: N/A;  . NASAL SEPTUM SURGERY  1998     Current Meds  Medication Sig  . aspirin 81 MG tablet Take 81 mg by mouth daily.  . carvedilol (COREG) 6.25 MG tablet Take 1 tablet (6.25 mg total) by mouth 2 (two) times daily.  . cetirizine-pseudoephedrine (ZYRTEC-D) 5-120 MG per tablet Take 1 tablet by mouth at bedtime.  . fluticasone (FLONASE) 50 MCG/ACT nasal spray Place 1 spray into the nose daily.  . meloxicam (MOBIC) 15 MG tablet Take 15 mg by mouth daily.  . Multiple Vitamin (MULTIVITAMIN WITH MINERALS) TABS Take 1 tablet by mouth daily.  . simvastatin (ZOCOR) 10 MG tablet Take 10 mg by mouth daily at 6 PM.      Allergies:   Patient has no known allergies.   Social History   Tobacco Use  . Smoking status: Former Smoker    Packs/day: 1.00    Years: 3.00    Pack years: 3.00    Quit date: 07/14/1987    Years since quitting: 32.8  . Smokeless tobacco: Never Used  Vaping Use  . Vaping Use: Never used  Substance Use Topics  . Alcohol use: Yes    Comment: rare  . Drug use: No     Family Hx: The patient's family history includes Heart attack in his brother, brother, father, and mother; Heart disease in his brother and brother; Hyperlipidemia in his father and mother; Hypertension in his sister.  ROS:   Please see the history of present illness.     All other systems reviewed and are negative.   Labs/Other Tests and Data Reviewed:    Recent Labs: 08/17/2019: BUN 26; Creatinine, Ser 1.01; Potassium 4.8; Sodium 139    Recent Lipid Panel No results found for: CHOL, TRIG, HDL, CHOLHDL, LDLCALC, LDLDIRECT  Wt Readings from Last 3 Encounters:  05/06/20 230 lb (104.3 kg)  09/07/19 228 lb 1.9 oz (103.5 kg)  04/19/19 216 lb (98 kg)     Objective:    Vital Signs:  BP (!) 152/78   Ht 5\' 10"  (1.778 m)   Wt 230 lb (104.3 kg)   BMI 33.00 kg/m    ASSESSMENT & PLAN:    1.  OSA -  The patient is tolerating PAP therapy well without any problems. The patient has been using and benefiting from PAP use and will continue to  benefit from therapy.  -he has not been using his device recently because he is waking up and the mask is leaking.  He uses a full face mask but has never changed out the cushion.  He has never gotten any extra cushions. -I have encouraged him to use his device nightly and if he has any issues he needs to call his DME -He would like to try a nasal pillow mask with chin strap which I will order -encouraged better compliance  2.  Obesity -I have encouraged him to get into a routine exercise program and cut back on carbs and portions.   3.  Elevated BP with no dx of HTN - BP is borderline elevated today  -continue Carvedilol 6.25mg  BID  COVID-19 Education: The signs and symptoms of COVID-19 were discussed with the patient and how to seek care for testing (follow up with PCP or arrange E-visit).  The importance of social distancing was discussed today.  Patient Risk:   After full review of this patient's clinical status, I feel that they are at least moderate risk at this time.  Time:   Today, I have spent 20 minutes on telemedicine discussing medical problems including OSA,, obesity and reviewing patient's chart including Sleep study, PAP compliance download.  Medication Adjustments/Labs and Tests Ordered: Current medicines are reviewed at length with the patient today.  Concerns regarding medicines are outlined above.  Tests Ordered: No orders of the defined types were placed in this  encounter.  Medication Changes: No orders of the defined types were placed in this encounter.   Disposition:  Follow up in 1 year(s)  Signed, Fransico Him, MD  05/06/2020 8:39 AM    [facility]

## 2020-05-07 ENCOUNTER — Telehealth: Payer: Self-pay | Admitting: *Deleted

## 2020-05-07 DIAGNOSIS — G4733 Obstructive sleep apnea (adult) (pediatric): Secondary | ICD-10-CM

## 2020-05-07 NOTE — Telephone Encounter (Signed)
Order placed to [facility].

## 2020-05-07 NOTE — Telephone Encounter (Signed)
-----   Message from Sueanne Margarita, MD sent at 05/06/2020  8:45 AM EST ----- Please set up an appt with DME to get a mask fitting.  Please order a nasal pillow mask of choice and set him up to get supplies (nasal pillows supplies every 3 months)

## 2020-08-18 ENCOUNTER — Other Ambulatory Visit (HOSPITAL_COMMUNITY): Payer: Self-pay | Admitting: Orthopedic Surgery

## 2020-08-18 DIAGNOSIS — M79604 Pain in right leg: Secondary | ICD-10-CM

## 2020-08-18 DIAGNOSIS — M7989 Other specified soft tissue disorders: Secondary | ICD-10-CM

## 2020-08-19 ENCOUNTER — Ambulatory Visit (HOSPITAL_COMMUNITY)
Admission: RE | Admit: 2020-08-19 | Discharge: 2020-08-19 | Disposition: A | Discharge status: Home / Self Care | Payer: No Typology Code available for payment source | Source: Ambulatory Visit | Attending: Orthopedic Surgery | Admitting: Orthopedic Surgery

## 2020-08-19 ENCOUNTER — Other Ambulatory Visit: Payer: Self-pay

## 2020-08-19 DIAGNOSIS — R600 Localized edema: Secondary | ICD-10-CM | POA: Insufficient documentation

## 2020-08-19 DIAGNOSIS — M7989 Other specified soft tissue disorders: Secondary | ICD-10-CM

## 2020-08-19 DIAGNOSIS — M79604 Pain in right leg: Secondary | ICD-10-CM | POA: Diagnosis not present

## 2020-08-19 NOTE — Progress Notes (Signed)
Lower extremity venous  has been completed.   Preliminary results in CV Proc.   Abram Sander 08/19/2020 1:06 PM

## 2020-09-06 ENCOUNTER — Other Ambulatory Visit: Payer: Self-pay | Admitting: Interventional Cardiology

## 2020-09-08 NOTE — Telephone Encounter (Signed)
Rx(s) sent to pharmacy electronically.  

## 2020-09-11 ENCOUNTER — Other Ambulatory Visit: Payer: Self-pay | Admitting: *Deleted

## 2020-09-11 DIAGNOSIS — I712 Thoracic aortic aneurysm, without rupture, unspecified: Secondary | ICD-10-CM

## 2020-09-27 ENCOUNTER — Other Ambulatory Visit: Payer: No Typology Code available for payment source

## 2020-10-01 ENCOUNTER — Ambulatory Visit
Admission: RE | Admit: 2020-10-01 | Discharge: 2020-10-01 | Disposition: A | Discharge status: Home / Self Care | Payer: No Typology Code available for payment source | Source: Ambulatory Visit | Attending: Interventional Cardiology | Admitting: Interventional Cardiology

## 2020-10-01 ENCOUNTER — Other Ambulatory Visit: Payer: Self-pay

## 2020-10-01 DIAGNOSIS — I712 Thoracic aortic aneurysm, without rupture, unspecified: Secondary | ICD-10-CM

## 2020-10-01 MED ORDER — GADOBENATE DIMEGLUMINE 529 MG/ML IV SOLN
20.0000 mL | Freq: Once | INTRAVENOUS | Status: AC | PRN
  Administered 2020-10-01: 20 mL via INTRAVENOUS

## 2020-10-01 NOTE — Progress Notes (Signed)
Cardiology Office Note   Date:  10/02/2020   ID:  Daniel Humphrey, Daniel Humphrey 1956/01/26, MRN [id number]  PCP:  Shirline Frees, MD    No chief complaint on file.  THoracic aortic aneurysm  Wt Readings from Last 3 Encounters:  10/02/20 227 lb 12.8 oz (103.3 kg)  05/06/20 230 lb (104.3 kg)  09/07/19 228 lb 1.9 oz (103.5 kg)       History of Present Illness: Daniel Humphrey is a 65 y.o. male  who has had a strong family h/o CAD. Three older brothers with stents or CABG or MI. He also has been followed for a thoracic aortic aneurysm.   Negative nuclear study in 10/14.    He had a sleep study showing sleep apnea several years ago, but never followed up with CPAP therapy.   He never had knee surgery in 2019.  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  THis was done at [facility].  No cardiac issues.    Coronary CTA in 2020 showed: "Coronary calcium score of 38. This was 57 percentile for age and sex matched control.   2. Normal coronary origin with right dominance.   3. Minimal non-obstructive CAD. Risk factor modifications are recommended.   4. Mild ascending aortic aneurysm with maximum diameter 42 x 41 mm. Annual follow up with CTA or MRA is recommended."   June 2021 scan showed: "Minor aneurysmal dilatation of the ascending thoracic aorta, maximal diameter 4.2 cm. Sinotubular junction measures 3.4 cm. Sinus of Valsalva measures 4.3 cm. Transverse aorta and descending thoracic aorta are normal in caliber. Patent 3 vessel arch anatomy with mild tortuosity evident. No significant interval change. No focal wall thickening, dissection, mediastinal hemorrhage, or hematoma. Normal heart size. 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  Pfizer.  TKR was planned in 2021 and done in Juy 2021.    2022 MRA  showed: "4.3 cm ascending thoracic aortic aneurysm (previously 4.2) without complicating features. Recommend annual imaging followup by CTA or MRA."  Not much exercise other than what he gets at work.   Home readings have been a littte high on BP.    Past Medical History:  Diagnosis Date   Aortic root dilation (HCC)    Arthritis    OA- R knee, hands    ED (erectile dysfunction)    Hepatitis 1975   A viral   High cholesterol    Mild CAD    a. by cardiac CT 08/2018.   Obesity    OSA (obstructive sleep apnea) 04/15/2015   Severe OSA with AHI 35.2 and successful CPAP titration to 9cmH2O, needs CPAP, will continue to explore    Seasonal allergies     Past Surgical History:  Procedure Laterality Date   CERVICAL FUSION  1999   COLON RESECTION Right 07/29/2017   Procedure: POSSIBLE RIGHT COLON RESECTION;  Surgeon: Alphonsa Overall, MD;  Location: [facility];  Service: General;  Laterality: Right;   COLONOSCOPY WITH PROPOFOL  04/19/2012   Procedure: COLONOSCOPY WITH PROPOFOL;  Surgeon: Jeryl Columbia, MD;  Location: [facility];  Service: Endoscopy;  Laterality: N/A;   COLONOSCOPY WITH PROPOFOL N/A 01/17/2015   Procedure: COLONOSCOPY WITH PROPOFOL;  Surgeon: Clarene Essex, MD;  Location: [facility];  Service: Endoscopy;  Laterality: N/A;   HOT HEMOSTASIS  04/19/2012   Procedure: HOT HEMOSTASIS (ARGON PLASMA COAGULATION/BICAP);  Surgeon: Jeryl Columbia, MD;  Location: [facility];  Service: Endoscopy;  Laterality: N/A;   HOT HEMOSTASIS N/A 01/17/2015   Procedure: HOT HEMOSTASIS (ARGON PLASMA COAGULATION/BICAP);  Surgeon: Clarene Essex, MD;  Location: [facility];  Service: Endoscopy;  Laterality: N/A;   IR RADIOLOGIST EVAL & MGMT  05/25/2017   IR RADIOLOGIST EVAL & MGMT  06/08/2017   LAPAROSCOPIC APPENDECTOMY N/A 07/29/2017   Procedure: APPENDECTOMY LAPAROSCOPIC ERAS PATHWAY;  Surgeon: Alphonsa Overall, MD;  Location: [facility];  Service: General;  Laterality: N/A;   NASAL SEPTUM SURGERY  1998     Current  Outpatient Medications  Medication Sig Dispense Refill   aspirin 325 MG tablet      carvedilol (COREG) 6.25 MG tablet TAKE 1 TABLET BY MOUTH TWICE A DAY 180 tablet 3   cetirizine-pseudoephedrine (ZYRTEC-D) 5-120 MG per tablet Take 1 tablet by mouth at bedtime.     fluticasone (FLONASE) 50 MCG/ACT nasal spray Place 1 spray into the nose daily.     meloxicam (MOBIC) 15 MG tablet Take 15 mg by mouth daily.     Multiple Vitamin (MULTIVITAMIN WITH MINERALS) TABS Take 1 tablet by mouth daily.     simvastatin (ZOCOR) 10 MG tablet Take 10 mg by mouth daily at 6 PM.      aspirin 81 MG tablet Take 81 mg by mouth daily. (Patient not taking: Reported on 10/02/2020)     No current facility-administered medications for this visit.    Allergies:   Patient has no known allergies.    Social History:  The patient  reports that he quit smoking about 33 years ago. His smoking use included cigarettes. He has a 3.00 pack-year smoking history. He has never used smokeless tobacco. He reports current alcohol use. He reports that he does not use drugs.   Family History:  The patient's family history includes Heart attack in his brother, brother, father, and mother; Heart disease in his brother and brother; Hyperlipidemia in his father and mother; Hypertension in his sister.    ROS:  Please see the history of present illness.   Otherwise, review of systems are positive for sometimes skipping CPAP use.   All other systems are reviewed and negative.    PHYSICAL EXAM: VS:  BP 140/80   Pulse 79   Ht 5\' 10"  (1.778 m)   Wt 227 lb 12.8 oz (103.3 kg)   SpO2 95%   BMI 32.69 kg/m  , BMI Body mass index is 32.69 kg/m. GEN: Well nourished, well developed, in no acute distress HEENT: normal Neck: no JVD, carotid bruits, or masses Cardiac: RRR; no murmurs, rubs, or gallops,no edema  Respiratory:  clear to auscultation bilaterally, normal work of breathing GI: soft, nontender, nondistended, + BS MS: no deformity or  atrophy Skin: warm and dry, no rash Neuro:  Strength and sensation are intact Psych: euthymic mood, full affect   EKG:   The ekg ordered today demonstrates NSR, nonspecific inferior changes   Recent Labs: No results found for requested labs within last 8760 hours.   Lipid Panel No results found for: CHOL, TRIG, HDL, CHOLHDL, VLDL, LDLCALC, LDLDIRECT   Other studies Reviewed: Additional studies/ records that were reviewed today with results demonstrating: labs reviewed, MRA reviewed.   ASSESSMENT AND PLAN:  Dilated ascending aorta:  Stable on most recent MRA.  Avoid heavy lifting.   HTN: Some high readings at home.  Limit salt in diet.  Increase  Coreg to 12.5 mg BID.  He will send [place] home readings.  If they are still elevated, can have him see PharmD HTN clinic.   Mild CAD: No angina. Continue preventive therapy.  Only needs 81 mg daily dose.  He will decrease the dose back to 81 mg soon.   Hyperlipidemia: Whole food, plant based diet.  LDL 105 in 2021.  Continue simvastatin.  Had a clot in a varicose vein recently.  Took high dose aspirin temporarily.   OSA: Using CPAP.  Tolerating nose piece.  Not fully compliant.   Will get back to full compliance.    Current medicines are reviewed at length with the patient today.  The patient concerns regarding his medicines were addressed.  The following changes have been made:  Increase Coreg to 12.5 mg BID.   Labs/ tests ordered today include:  No orders of the defined types were placed in this encounter.   Recommend 150 minutes/week of aerobic exercise Low fat, low carb, high fiber diet recommended  Disposition:   FU in 1 year   Signed, Larae Grooms, MD  10/02/2020 1:28 PM    [facility] [address], [place], [place]  [address] Phone: [phone]; Fax: [phone]

## 2020-10-02 ENCOUNTER — Encounter: Payer: Self-pay | Admitting: Interventional Cardiology

## 2020-10-02 ENCOUNTER — Ambulatory Visit: Payer: No Typology Code available for payment source | Admitting: Interventional Cardiology

## 2020-10-02 VITALS — BP 140/80 | HR 79 | Ht 70.0 in | Wt 227.8 lb

## 2020-10-02 DIAGNOSIS — I712 Thoracic aortic aneurysm, without rupture, unspecified: Secondary | ICD-10-CM

## 2020-10-02 DIAGNOSIS — I2510 Atherosclerotic heart disease of native coronary artery without angina pectoris: Secondary | ICD-10-CM | POA: Diagnosis not present

## 2020-10-02 DIAGNOSIS — E782 Mixed hyperlipidemia: Secondary | ICD-10-CM

## 2020-10-02 DIAGNOSIS — I351 Nonrheumatic aortic (valve) insufficiency: Secondary | ICD-10-CM

## 2020-10-02 DIAGNOSIS — G4733 Obstructive sleep apnea (adult) (pediatric): Secondary | ICD-10-CM | POA: Diagnosis not present

## 2020-10-02 DIAGNOSIS — E669 Obesity, unspecified: Secondary | ICD-10-CM

## 2020-10-02 MED ORDER — CARVEDILOL 12.5 MG PO TABS: 12.5000 mg | ORAL_TABLET | Freq: Two times a day (BID) | ORAL | 3 refills | Status: DC

## 2020-10-02 NOTE — Patient Instructions (Signed)
Medication Instructions:  Your physician has recommended you make the following change in your medication: Increase Carvedilol to 12.5 mg by mouth twice daily  *If you need a refill on your cardiac medications before your next appointment, please call your pharmacy*   Lab Work: none If you have labs (blood work) drawn today and your tests are completely normal, you will receive your results only by: [place] (if you have MyChart) OR A paper copy in the mail If you have any lab test that is abnormal or we need to change your treatment, we will call you to review the results.   Testing/Procedures: Chest MRA to be done in one year   Follow-Up: At [facility], you and your health needs are our priority.  As part of our continuing mission to provide you with exceptional heart care, we have created designated Provider Care Teams.  These Care Teams include your primary Cardiologist (physician) and Advanced Practice Providers (APPs -  Physician Assistants and Nurse Practitioners) who all work together to provide you with the care you need, when you need it.  We recommend signing up for the patient portal called "MyChart".  Sign up information is provided on this After Visit Summary.  MyChart is used to connect with patients for Virtual Visits (Telemedicine).  Patients are able to view lab/test results, encounter notes, upcoming appointments, etc.  Non-urgent messages can be sent to your provider as well.   To learn more about what you can do with MyChart, go to [email or web address].    Your next appointment:   12 month(s)  The format for your next appointment:   In Person  Provider:   You may see Larae Grooms, MD or one of the following Advanced Practice Providers on your designated Care Team:   Melina Copa, PA-C Ermalinda Barrios, PA-C   Other Instructions  Avoid heavy straining and lifting  Check blood pressure and send in readings through my chart

## 2021-10-01 ENCOUNTER — Ambulatory Visit
Admission: RE | Admit: 2021-10-01 | Discharge: 2021-10-01 | Disposition: A | Discharge status: Home / Self Care | Payer: No Typology Code available for payment source | Source: Ambulatory Visit | Attending: Interventional Cardiology | Admitting: Interventional Cardiology

## 2021-10-01 DIAGNOSIS — I712 Thoracic aortic aneurysm, without rupture, unspecified: Secondary | ICD-10-CM

## 2021-10-01 MED ORDER — GADOBENATE DIMEGLUMINE 529 MG/ML IV SOLN
20.0000 mL | Freq: Once | INTRAVENOUS | Status: AC | PRN
  Administered 2021-10-01: 20 mL via INTRAVENOUS

## 2021-10-05 ENCOUNTER — Other Ambulatory Visit: Payer: Self-pay | Admitting: *Deleted

## 2021-10-05 DIAGNOSIS — I712 Thoracic aortic aneurysm, without rupture, unspecified: Secondary | ICD-10-CM

## 2021-10-28 NOTE — Progress Notes (Unsigned)
Cardiology Office Note   Date:  10/29/2021   ID:  Daniel Humphrey, DOB 1955/05/03, MRN [id number]  PCP:  Shirline Frees, MD    No chief complaint on file.  THoracic arotic aneurysm  Wt Readings from Last 3 Encounters:  10/29/21 236 lb 6.4 oz (107.2 kg)  10/02/20 227 lb 12.8 oz (103.3 kg)  05/06/20 230 lb (104.3 kg)       History of Present Illness: Daniel Humphrey is a 66 y.o. male  who has had a strong family h/o CAD. Three older brothers with stents or CABG or MI. He also has been followed for a thoracic aortic aneurysm.   Negative nuclear study in 10/14.    He had a sleep study showing sleep apnea several years ago, but never followed up with CPAP therapy.   He never had knee surgery in 2019.  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  THis was done at [facility].  No cardiac issues.    Coronary CTA in 2020 showed: "Coronary calcium score of 38. This was 59 percentile for age and sex matched control.   2. Normal coronary origin with right dominance.   3. Minimal non-obstructive CAD. Risk factor modifications are recommended.   4. Mild ascending aortic aneurysm with maximum diameter 42 x 41 mm. Annual follow up with CTA or MRA is recommended."   June 2021 scan showed: "Minor aneurysmal dilatation of the ascending thoracic aorta, maximal diameter 4.2 cm. Sinotubular junction measures 3.4 cm. Sinus of Valsalva measures 4.3 cm. Transverse aorta and descending thoracic aorta are normal in caliber. Patent 3 vessel arch anatomy with mild tortuosity evident. No significant interval change. No focal wall thickening, dissection, mediastinal hemorrhage, or hematoma. Normal heart size. 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  Pfizer.   TKR was planned in 2021 and done in Juy 2021.    Had clot  in varicose vein in 2022-treated with high dose aspirin.  7/23 MRA showed: "Stable uncomplicated mild fusiform aneurysmal dilatation of the aortic root and ascending thoracic aorta measuring 50 mm and 43 mm in diameter respectively, grossly unchanged compared to the 06/2013 examination by my direct remeasurement. Aortic aneurysm NOS"  Walking limited by the left knee pain.  Needs left knee replacement.   Denies : Chest pain. Dizziness. Leg edema. Nitroglycerin use. Orthopnea. Palpitations. Paroxysmal nocturnal dyspnea.  Syncope.    Mild DOE walking up hills.  Does not have to stop.  Can pace himself and continue.   Walks a lot on his job. No other exercise.  Past Medical History:  Diagnosis Date   Aortic root dilation (HCC)    Arthritis    OA- R knee, hands    ED (erectile dysfunction)    Hepatitis 1975   A viral   High cholesterol    Mild CAD    a. by cardiac CT 08/2018.   Obesity    OSA (obstructive sleep apnea) 04/15/2015   Severe OSA with AHI 35.2 and successful CPAP titration to 9cmH2O, needs CPAP, will continue to explore    Seasonal allergies     Past Surgical History:  Procedure Laterality Date   CERVICAL FUSION  1999   COLON RESECTION Right 07/29/2017   Procedure: POSSIBLE RIGHT COLON RESECTION;  Surgeon: Alphonsa Overall, MD;  Location:  [facility];  Service: General;  Laterality: Right;   COLONOSCOPY WITH PROPOFOL  04/19/2012   Procedure: COLONOSCOPY WITH PROPOFOL;  Surgeon: Jeryl Columbia, MD;  Location: [facility];  Service: Endoscopy;  Laterality: N/A;   COLONOSCOPY WITH PROPOFOL N/A 01/17/2015   Procedure: COLONOSCOPY WITH PROPOFOL;  Surgeon: Clarene Essex, MD;  Location: [facility];  Service: Endoscopy;  Laterality: N/A;   HOT HEMOSTASIS  04/19/2012   Procedure: HOT HEMOSTASIS (ARGON PLASMA COAGULATION/BICAP);  Surgeon: Jeryl Columbia, MD;  Location: [facility];  Service: Endoscopy;  Laterality: N/A;   HOT HEMOSTASIS N/A 01/17/2015   Procedure: HOT HEMOSTASIS (ARGON PLASMA  COAGULATION/BICAP);  Surgeon: Clarene Essex, MD;  Location: [facility];  Service: Endoscopy;  Laterality: N/A;   IR RADIOLOGIST EVAL & MGMT  05/25/2017   IR RADIOLOGIST EVAL & MGMT  06/08/2017   LAPAROSCOPIC APPENDECTOMY N/A 07/29/2017   Procedure: APPENDECTOMY LAPAROSCOPIC ERAS PATHWAY;  Surgeon: Alphonsa Overall, MD;  Location: [facility];  Service: General;  Laterality: N/A;   NASAL SEPTUM SURGERY  1998     Current Outpatient Medications  Medication Sig Dispense Refill   amoxicillin-clavulanate (AUGMENTIN) 875-125 MG tablet 1 tablet     aspirin 81 MG tablet Take 81 mg by mouth daily.     carvedilol (COREG) 12.5 MG tablet Take 1 tablet (12.5 mg total) by mouth 2 (two) times daily. 180 tablet 3   cetirizine-pseudoephedrine (ZYRTEC-D) 5-120 MG per tablet Take 1 tablet by mouth at bedtime.     fluticasone (FLONASE) 50 MCG/ACT nasal spray Place 1 spray into the nose daily.     Multiple Vitamin (MULTIVITAMIN WITH MINERALS) TABS Take 1 tablet by mouth daily.     simvastatin (ZOCOR) 10 MG tablet Take 10 mg by mouth daily at 6 PM.      No current facility-administered medications for this visit.    Allergies:   Patient has no known allergies.    Social History:  The patient  reports that he quit smoking about 34 years ago. His smoking use included cigarettes. He has a 3.00 pack-year smoking history. He has never used smokeless tobacco. He reports current alcohol use. He reports that he does not use drugs.   Family History:  The patient's family history includes Heart attack in his brother, brother, father, and mother; Heart disease in his brother and brother; Hyperlipidemia in his father and mother; Hypertension in his sister.    ROS:  Please see the history of present illness.   Otherwise, review of systems are positive increased BP on meloxicam, decreased when he stopped the medicine (140's).   All other systems are reviewed and negative.    PHYSICAL EXAM: VS:  BP (!) 142/80   Pulse 68   Ht 5'  10" (1.778 m)   Wt 236 lb 6.4 oz (107.2 kg)   SpO2 98%   BMI 33.92 kg/m  , BMI Body mass index is 33.92 kg/m. GEN: Well nourished, well developed, in no acute distress HEENT: normal Neck: no JVD, carotid bruits, or masses Cardiac: RRR; no murmurs, rubs, or gallops,no edema  Respiratory:  clear to auscultation bilaterally, normal work of breathing GI: soft, nontender, nondistended, + BS MS: no deformity or atrophy Skin: warm and dry, no rash Neuro:  Strength and sensation are intact Psych: euthymic mood, full affect   EKG:   The ekg ordered today demonstrates normal sinus rhythm, no ST-T wave changes   Recent Labs: No results found for requested labs within last 365 days.   Lipid  Panel No results found for: "CHOL", "TRIG", "HDL", "CHOLHDL", "VLDL", "LDLCALC", "LDLDIRECT"   Other studies Reviewed: Additional studies/ records that were reviewed today with results demonstrating: Labs reviewed.   ASSESSMENT AND PLAN:  Thoracic aortic aneurysm: Stable by MRA.  Has not seen cardiac surgery.  Needs aggressive BP control.  Avoid fluoroquinolone antibiotics.  HTN: Would increase Coreg to 25 mg BID for better blood pressure control.  Try cetirizine without decongestant.  If blood pressure drops, could Go back to carvedilol 12.5 mg twice a day. Mild CAD: noted on CTA.  Hyperlipidemia: In September 22 total cholesterol 190 LDL 118 HDL 47 triglycerides 143.  Stop simvastatin, start rosuvastatin 10 mg daily.Check liver and lipid tests in about 2 months- has blood work with PMD scheduled for 10/23.  OSA: Not using CPAP regularly.  Untreated OSA may contribute to increased blood pressure.  We can check with the sleep apnea team regarding other options for controlling sleep apnea.  Refer back to sleep clinic.  He thinks he may need a sleep study prior to his knee replacement which she is hoping to have in October.   Current medicines are reviewed at length with the patient today.  The patient  concerns regarding his medicines were addressed.  The following changes have been made:  as above  Labs/ tests ordered today include:  No orders of the defined types were placed in this encounter.   Recommend 150 minutes/week of aerobic exercise Low fat, low carb, high fiber diet recommended  Disposition:   FU in 6 weeks with HTN clinic, 1 year with me   Signed, Larae Grooms, MD  10/29/2021 8:32 AM    [address], [place], [place]  [address] Phone: [phone]; Fax: [phone]

## 2021-10-29 ENCOUNTER — Ambulatory Visit: Payer: No Typology Code available for payment source | Admitting: Interventional Cardiology

## 2021-10-29 VITALS — BP 142/80 | HR 68 | Ht 70.0 in | Wt 236.4 lb

## 2021-10-29 DIAGNOSIS — I712 Thoracic aortic aneurysm, without rupture, unspecified: Secondary | ICD-10-CM | POA: Diagnosis not present

## 2021-10-29 DIAGNOSIS — E782 Mixed hyperlipidemia: Secondary | ICD-10-CM

## 2021-10-29 DIAGNOSIS — I10 Essential (primary) hypertension: Secondary | ICD-10-CM

## 2021-10-29 DIAGNOSIS — E669 Obesity, unspecified: Secondary | ICD-10-CM

## 2021-10-29 DIAGNOSIS — G4733 Obstructive sleep apnea (adult) (pediatric): Secondary | ICD-10-CM

## 2021-10-29 DIAGNOSIS — I351 Nonrheumatic aortic (valve) insufficiency: Secondary | ICD-10-CM

## 2021-10-29 DIAGNOSIS — I2510 Atherosclerotic heart disease of native coronary artery without angina pectoris: Secondary | ICD-10-CM

## 2021-10-29 MED ORDER — CARVEDILOL 25 MG PO TABS: 25.0000 mg | ORAL_TABLET | Freq: Two times a day (BID) | ORAL | 3 refills | Status: DC

## 2021-10-29 MED ORDER — ROSUVASTATIN CALCIUM 10 MG PO TABS: 10.0000 mg | ORAL_TABLET | Freq: Every day | ORAL | 3 refills | Status: DC

## 2021-10-29 NOTE — Patient Instructions (Signed)
Medication Instructions:  Your physician has recommended you make the following change in your medication:  Take Zyrtec without decongestant only Stop taking simvastatin Start taking rosuvastatin '10mg'$  daily Increase carvedilol to '25mg'$  daily  *If you need a refill on your cardiac medications before your next appointment, please call your pharmacy*   Follow-Up: At [facility], you and your health needs are our priority.  As part of our continuing mission to provide you with exceptional heart care, we have created designated Provider Care Teams.  These Care Teams include your primary Cardiologist (physician) and Advanced Practice Providers (APPs -  Physician Assistants and Nurse Practitioners) who all work together to provide you with the care you need, when you need it.  We recommend signing up for the patient portal called "MyChart".  Sign up information is provided on this After Visit Summary.  MyChart is used to connect with patients for Virtual Visits (Telemedicine).  Patients are able to view lab/test results, encounter notes, upcoming appointments, etc.  Non-urgent messages can be sent to your provider as well.   To learn more about what you can do with MyChart, go to [email or web address].    Your next appointment:   1 year(s)  The format for your next appointment:   In Person  Provider:   Larae Grooms, MD     Other Instructions Needs follow up appointment with Dr. Radford Pax for Sleep clinic 6 weeks.

## 2021-11-03 ENCOUNTER — Other Ambulatory Visit: Payer: Self-pay | Admitting: Interventional Cardiology

## 2021-11-10 ENCOUNTER — Telehealth: Payer: Self-pay | Admitting: *Deleted

## 2021-11-10 NOTE — Telephone Encounter (Signed)
   Pre-operative Risk Assessment    Patient Name: Daniel Humphrey  DOB: [birth year] MRN: [id number]      Request for Surgical Clearance    Procedure:   LEFT TOTAL KNEE ARTHROPLASTY  Date of Surgery:  Clearance TBD                                 Surgeon:  DR. DANIEL MARCHWIANY  Surgeon's Group or Practice Name:  [facility] Phone number:  [phone] EXT [phone] ATTN: [place] Fax number:  [phone]   Type of Clearance Requested:   - Medical ; ASA    Type of Anesthesia:  Spinal   Additional requests/questions:    Jiles Prows   11/10/2021, 2:13 PM

## 2021-11-11 ENCOUNTER — Telehealth: Payer: Self-pay | Admitting: *Deleted

## 2021-11-11 NOTE — Telephone Encounter (Signed)
OSA: Not using CPAP regularly.  Untreated OSA may contribute to increased blood pressure.  We can check with the sleep apnea team regarding other options for controlling sleep apnea.  Refer back to sleep clinic.  He thinks he may need a sleep study prior to his knee replacement which she is hoping to have in October.

## 2021-11-11 NOTE — Telephone Encounter (Signed)
-----   Message from Jettie Booze, MD sent at 10/29/2021  9:15 AM EDT ----- Needs a sleep clinic follow-up visit.  He has not tolerated his nose piece well. THanks.

## 2021-11-11 NOTE — Telephone Encounter (Signed)
No, he is not interested in the inspire device. Patient says he likes the nose piece that he has but he had went on vacation and never started back on his therapy but he will do that this week.

## 2021-11-11 NOTE — Telephone Encounter (Signed)
   Pt was just seen by Dr. Irish Lack 8/17. Will see if he can provide clearance from that visit. If not pt will need VV.  ASA can be held 7 days.  Richardson Dopp, PA-C    11/11/2021 8:13 AM

## 2021-11-12 NOTE — Telephone Encounter (Signed)
No further cardiac testing needed prior to TKR.

## 2021-11-17 NOTE — Telephone Encounter (Signed)
  Primary Cardiologist: Larae Grooms, MD  Chart reviewed as part of pre-operative protocol coverage. Given past medical history and time since last visit, based on ACC/AHA guidelines, Daniel Humphrey would be at acceptable risk for the planned procedure without further cardiovascular testing.   Aspirin may be held for 7 days prior to surgery and should be resumed as soon as possible postoperatively.  I will route this recommendation to the requesting party via Epic fax function and remove from pre-op pool.  Please call with questions.  Emmaline Life, NP-C    11/17/2021, 12:29 PM [address] Office [phone] Fax [phone]

## 2021-12-01 ENCOUNTER — Ambulatory Visit: Payer: No Typology Code available for payment source | Attending: Interventional Cardiology | Admitting: Pharmacist

## 2021-12-01 DIAGNOSIS — I10 Essential (primary) hypertension: Secondary | ICD-10-CM

## 2021-12-01 NOTE — Patient Instructions (Addendum)
OMRON blood pressure cuff 3 series or bronze series A&D medical essential blood pressure cuff  Please pick up a new upper arm blood pressure cuff. Start checking blood pressure 1-2 times a day. Record those readings and bring them and your cuff with you to your next appointment.  Continue carvedilol '25mg'$  twice a day  Call me at [phone] with any questions  Try Zyzal or Allegra instead of zyrtec. Try use a saline rinse and try not to due sudafed.   Your blood pressure goal is <130/80  To check your pressure at home you will need to:  1. Sit up in a chair, with feet flat on the floor and back supported. Do not cross your ankles or legs. 2. Rest your left arm so that the cuff is about heart level. If the cuff goes on your upper arm,  then just relax the arm on the table, arm of the chair or your lap. If you have a wrist cuff, we  suggest relaxing your wrist against your chest (think of it as Pledging the Flag with the  wrong arm).  3. Place the cuff snugly around your arm, about 1 inch above the crook of your elbow. The  cords should be inside the groove of your elbow.  4. Sit quietly, with the cuff in place, for about 5 minutes. After that 5 minutes press the power  button to start a reading. 5. Do not talk or move while the reading is taking place.  6. Record your readings on a sheet of paper. Although most cuffs have a memory, it is often  easier to see a pattern developing when the numbers are all in front of you.  7. You can repeat the reading after 1-3 minutes if it is recommended  Make sure your bladder is empty and you have not had caffeine or tobacco within the last 30 min  Always bring your blood pressure log with you to your appointments. If you have not brought your monitor in to be double checked for accuracy, please bring it to your next appointment.  You can find a list of validated (accurate) blood pressure cuffs at [email or web address].org

## 2021-12-01 NOTE — Progress Notes (Signed)
Patient ID: Daniel Humphrey                 DOB: 1955-09-11                      MRN: [id number]      HPI: Enrigue Hashimi is a 66 y.o. male referred by Dr. Irish Lack to HTN clinic. PMH is significant for thoracic aortic aneurysm, strong family hx of CAD, OSA untreated, CAD on CT and HTN. Coronary CTA in 2020 showed: "Coronary calcium score of 38. This was 87 percentile for age and sex matched control. Saw Dr. Irish Lack 10/30/26. BP was 142/80. He was asked to stop using decongestants and his carvedilol was increased to '25mg'$  twice a day.  Patient presents today to clinic. He has increased his carvedilol to '25mg'$  BID, but is still taking Zyrtec D. He states he tried to stop for a few days but his congestion got worse. He just started back using his CPAP machine. Says he can tell the difference if he doesn't use it. Denies any swelling. He said he had a little dizziness when he first started the higher dose of carvedilol, but it only lasted a couple days. He has a wrist cuff at home. He only checked about 4 times. Home readings were 128-130's/70's. Said sometimes his cuff works and sometimes it doesn't.   Current HTN meds: carvedilol '25mg'$  twice a day Previously tried:  BP goal: <130/80  Family History:   The patient's family history includes Heart attack in his brother, brother, father, and mother; Heart disease in his brother and brother; Hyperlipidemia in his father and mother; Hypertension in his sister.   Social History:  The patient  reports that he quit smoking about 34 years ago. His smoking use included cigarettes. He has a 3.00 pack-year smoking history. He has never used smokeless tobacco. He reports current alcohol use. He reports that he does not use drugs.   Diet: drinks water and 2 cups of coffee  Exercise: active job, not much outside of that  Home BP readings: 128-130/70's  Wt Readings from Last 3 Encounters:  10/29/21 236 lb 6.4 oz (107.2 kg)  10/02/20 227 lb 12.8 oz  (103.3 kg)  05/06/20 230 lb (104.3 kg)   BP Readings from Last 3 Encounters:  10/29/21 (!) 142/80  10/02/20 140/80  05/06/20 (!) 152/78   Pulse Readings from Last 3 Encounters:  10/29/21 68  10/02/20 79  09/07/19 95    Renal function: CrCl cannot be calculated (Patient's most recent lab result is older than the maximum 21 days allowed.).  Past Medical History:  Diagnosis Date   Aortic root dilation (HCC)    Arthritis    OA- R knee, hands    ED (erectile dysfunction)    Hepatitis 1975   A viral   High cholesterol    Mild CAD    a. by cardiac CT 08/2018.   Obesity    OSA (obstructive sleep apnea) 04/15/2015   Severe OSA with AHI 35.2 and successful CPAP titration to 9cmH2O, needs CPAP, will continue to explore    Seasonal allergies     Current Outpatient Medications on File Prior to Visit  Medication Sig Dispense Refill   amoxicillin-clavulanate (AUGMENTIN) 875-125 MG tablet 1 tablet     aspirin 81 MG tablet Take 81 mg by mouth daily.     carvedilol (COREG) 25 MG tablet Take 1 tablet (25 mg total) by mouth 2 (two) times daily.  180 tablet 3   cetirizine-pseudoephedrine (ZYRTEC-D) 5-120 MG per tablet Take 1 tablet by mouth at bedtime.     fluticasone (FLONASE) 50 MCG/ACT nasal spray Place 1 spray into the nose daily.     Multiple Vitamin (MULTIVITAMIN WITH MINERALS) TABS Take 1 tablet by mouth daily.     rosuvastatin (CRESTOR) 10 MG tablet Take 1 tablet (10 mg total) by mouth daily. 90 tablet 3   No current facility-administered medications on file prior to visit.    No Known Allergies  There were no vitals taken for this visit.   Assessment/Plan:  1. Hypertension - Blood pressure is above goal of <130/80 in clinic today. His home readings sound better, but I do not know the accuracy of his cuff and he has only checked a few times. I have asked him to pick up a new upper arm BP cuff. Gave him a few recommendations for cuffs. I would like him to check his BP 1-2 times  per day. Bring readings and cuff to next apt. We reviewed proper technique. Continue carvedilol '25mg'$  BID. I have encouraged him to stop using sudafed. I recommended saline rinses followed by 2 sprays of Flonase and changing antihistamines. Patient is having a knee replacement 10/25. I do not have availability prior to surgery. Will see patient in office 2 weeks after his surgery. He knows that if his BP is running consistently high, he can call me and we will make adjustments. Hopefully after surgery patient can increase physical activity and add in strength training. I have encouraged him to do so.   Thank you Ramond Dial, Pharm.D, BCPS, CPP [facility] [address] [place], [place] [address]  Phone: [phone]; Fax: [phone]

## 2021-12-15 NOTE — H&P (Signed)
KNEE ARTHROPLASTY ADMISSION H&P  Patient ID: Daniel Humphrey MRN: [id number] DOB/AGE: 64-Jun-1957 64 y.o.  Chief Complaint: left knee pain.  Planned Procedure Date: 01/06/22 Medical Clearance by Dr. Azalia Bilis   Cardiac Clearance by Dr. Irish Lack   HPI: Daniel Humphrey is a 66 y.o. male who presents for evaluation of OA LEFT KNEE. The patient has a history of pain and functional disability in the left knee due to arthritis and has failed non-surgical conservative treatments for greater than 12 weeks to include NSAID's and/or analgesics, corticosteriod injections, and activity modification.  Onset of symptoms was gradual, starting 2 years ago with gradually worsening course since that time. The patient noted no past surgery on the left knee.  Patient currently rates pain at 8 out of 10 with activity. Patient has night pain, worsening of pain with activity and weight bearing, and pain that interferes with activities of daily living.  Patient has evidence of subchondral sclerosis, periarticular osteophytes, and joint space narrowing by imaging studies.  There is no active infection.  Past Medical History:  Diagnosis Date   Aortic root dilation (HCC)    Arthritis    OA- R knee, hands    ED (erectile dysfunction)    Hepatitis 1975   A viral   High cholesterol    Mild CAD    a. by cardiac CT 08/2018.   Obesity    OSA (obstructive sleep apnea) 04/15/2015   Severe OSA with AHI 35.2 and successful CPAP titration to 9cmH2O, needs CPAP, will continue to explore    Seasonal allergies    Past Surgical History:  Procedure Laterality Date   CERVICAL FUSION  1999   COLON RESECTION Right 07/29/2017   Procedure: POSSIBLE RIGHT COLON RESECTION;  Surgeon: Alphonsa Overall, MD;  Location: [facility];  Service: General;  Laterality: Right;   COLONOSCOPY WITH PROPOFOL  04/19/2012   Procedure: COLONOSCOPY WITH PROPOFOL;  Surgeon: Jeryl Columbia, MD;  Location: [facility];  Service: Endoscopy;  Laterality:  N/A;   COLONOSCOPY WITH PROPOFOL N/A 01/17/2015   Procedure: COLONOSCOPY WITH PROPOFOL;  Surgeon: Clarene Essex, MD;  Location: [facility];  Service: Endoscopy;  Laterality: N/A;   HOT HEMOSTASIS  04/19/2012   Procedure: HOT HEMOSTASIS (ARGON PLASMA COAGULATION/BICAP);  Surgeon: Jeryl Columbia, MD;  Location: [facility];  Service: Endoscopy;  Laterality: N/A;   HOT HEMOSTASIS N/A 01/17/2015   Procedure: HOT HEMOSTASIS (ARGON PLASMA COAGULATION/BICAP);  Surgeon: Clarene Essex, MD;  Location: [facility];  Service: Endoscopy;  Laterality: N/A;   IR RADIOLOGIST EVAL & MGMT  05/25/2017   IR RADIOLOGIST EVAL & MGMT  06/08/2017   LAPAROSCOPIC APPENDECTOMY N/A 07/29/2017   Procedure: [place];  Surgeon: Alphonsa Overall, MD;  Location: [facility];  Service: General;  Laterality: N/A;   NASAL SEPTUM SURGERY  1998   No Known Allergies Prior to Admission medications   Medication Sig Start Date End Date Taking? Authorizing Provider  aspirin 81 MG tablet Take 81 mg by mouth daily.    [provider]  carvedilol (COREG) 25 MG tablet Take 1 tablet (25 mg total) by mouth 2 (two) times daily. 10/29/21   Jettie Booze, MD  cetirizine-pseudoephedrine (ZYRTEC-D) 5-120 MG per tablet Take 1 tablet by mouth at bedtime.    [provider]  fluticasone (FLONASE) 50 MCG/ACT nasal spray Place 1 spray into the nose daily.    [provider]  Multiple Vitamin (MULTIVITAMIN WITH MINERALS) TABS Take 1 tablet by mouth daily.  [provider]  rosuvastatin (CRESTOR) 10 MG tablet Take 1 tablet (10 mg total) by mouth daily. 10/29/21   Jettie Booze, MD   Social History   Socioeconomic History   Marital status: Married    Spouse name: Not on file   Number of children: Not on file   Years of education: Not on file   Highest education level: Not on file  Occupational History   Not on file  Tobacco Use   Smoking status: Former    Packs/day: 1.00    Years:  3.00    Total pack years: 3.00    Types: Cigarettes    Quit date: 07/14/1987    Years since quitting: 34.4   Smokeless tobacco: Never  Vaping Use   Vaping Use: Never used  Substance and Sexual Activity   Alcohol use: Yes    Comment: rare   Drug use: No   Sexual activity: Not on file  Other Topics Concern   Not on file  Social History Narrative   Not on file   Social Determinants of Health   Financial Resource Strain: Not on file  Food Insecurity: Not on file  Transportation Needs: Not on file  Physical Activity: Not on file  Stress: Not on file  Social Connections: Not on file   Family History  Problem Relation Age of Onset   Heart attack Mother    Hyperlipidemia Mother    Heart attack Father    Hyperlipidemia Father    Hypertension Sister    Heart attack Brother    Heart disease Brother    Heart attack Brother    Heart disease Brother     ROS: Currently denies lightheadedness, dizziness, Fever, chills, CP, SOB.   No personal history of DVT, PE, MI, or CVA. No loose teeth or dentures All other systems have been reviewed and were otherwise currently negative with the exception of those mentioned in the HPI and as above.  Objective: Vitals: Ht: 5'8" Wt: 238 lbs Temp: 98.2 BP: 152/79 Pulse: 74 O2 95% on room air.   Physical Exam: General: Alert, NAD.  Antalgic Gait  HEENT: EOMI, Good Neck Extension  Pulm: No increased work of breathing.  Clear B/L A/P w/o crackle or wheeze.  CV: RRR, No m/g/r appreciated  GI: soft, NT, ND. BS x 4 quadrants Neuro: CN II-XII grossly intact without focal deficit.  Sensation intact distally Skin: No lesions in the area of chief complaint MSK/Surgical Site:   + JLT. ROM 0-120 degrees.  5/5 strength in extension and flexion.  +EHL/FHL.  NVI.  Stable varus and valgus stress.    Imaging Review Plain radiographs demonstrate severe degenerative joint disease of the left knee.   The overall alignment issignificant varus. The bone quality  appears to be fair for age and reported activity level.  Preoperative templating of the joint replacement has been completed, documented, and submitted to the Operating Room personnel in order to optimize intra-operative equipment management.  Assessment: OA LEFT KNEE Active Problems:   * No active hospital problems. *   Plan: Plan for Procedure(s): TOTAL KNEE ARTHROPLASTY  The patient history, physical exam, clinical judgement of the provider and imaging are consistent with end stage degenerative joint disease and total joint arthroplasty is deemed medically necessary. The treatment options including medical management, injection therapy, and arthroplasty were discussed at length. The risks and benefits of Procedure(s): TOTAL KNEE ARTHROPLASTY were presented and reviewed.  The risks of nonoperative treatment, versus surgical intervention including  but not limited to continued pain, aseptic loosening, stiffness, dislocation/subluxation, infection, bleeding, nerve injury, blood clots, cardiopulmonary complications, morbidity, mortality, among others were discussed. The patient verbalizes understanding and wishes to proceed with the plan.  Patient is being admitted for inpatient treatment for surgery, pain control, PT, prophylactic antibiotics, VTE prophylaxis, progressive ambulation, ADL's and discharge planning. He will spend the night in observation.  Dental prophylaxis discussed and recommended for 2 years postoperatively.  The patient does meet the criteria for TXA which will be used perioperatively.   ASA 81 mg BID  will be used postoperatively for DVT prophylaxis in addition to SCDs, and early ambulation. Plan for Tylenol, Mobic, oxycodone for pain.   Zofran for nausea and vomiting. Colace for constipation prevention Pharmacy- [facility] [address] in [place] The patient is planning to be discharged home with OPPT and into the care of his wife Abigail Butts who can be reached at  [phone] Follow up appt 01/15/22 at 4:30pm     Britt Bottom, PA-C Office [phone] 12/15/2021 1:23 PM

## 2021-12-22 NOTE — Progress Notes (Addendum)
Anesthesia Review:  PCP: Mickel Crow Clearance dated 11/17/21 on chart  [place] 11/17/21 on chart  Cardiologist : Irish Lack- [place] 10/29/21  11/17/21- LOV Christen Bame, NP on chart  Chest x-ray : CT angio chest- 2022  EKG : 10/29/21- ekg on chart  CT cors- 2020  Echo : 2020  Stress test: Cardiac Cath :  Activity level: can do a flight of stairs without difficulty  Sleep Study/ CPAP : cpap  Fasting Blood Sugar :      / Checks Blood Sugar -- times a day:   Blood Thinner/ Instructions /Last Dose: ASA / Instructions/ Last Dose :   81 mg Aspirin

## 2021-12-23 ENCOUNTER — Ambulatory Visit: Payer: No Typology Code available for payment source | Attending: Cardiology | Admitting: Cardiology

## 2021-12-23 ENCOUNTER — Encounter: Payer: Self-pay | Admitting: Cardiology

## 2021-12-23 VITALS — BP 130/74 | HR 63 | Ht 70.0 in | Wt 238.2 lb

## 2021-12-23 DIAGNOSIS — G4733 Obstructive sleep apnea (adult) (pediatric): Secondary | ICD-10-CM | POA: Diagnosis not present

## 2021-12-23 DIAGNOSIS — I10 Essential (primary) hypertension: Secondary | ICD-10-CM

## 2021-12-23 NOTE — Progress Notes (Signed)
Date:  12/23/2021   ID:  Daniel Humphrey, DOB 03-09-1956, MRN [id number]   PCP:  Shirline Frees, MD  Cardiologist:  Casandra Doffing, MD  Electrophysiologist:  None   Chief Complaint:  OSA  History of Present Illness:    Daniel Humphrey is a 66 y.o. male  with a hx of ASCAD, HLD and OSA.  He had a sleep study a few years ago but never followed up with CPAP therapy. He saw Dr. Irish Lack and told him that he was not sleeping well and feeling tired during the day.  He was set up to have knee surgery and surgery was delayed until he could get the sleep study done and get on CPAP if needed.  Dr. Irish Lack ordered a sleep study which showed moderate OSA with an AHI of 25.9/hr and no significant central events.  The minimum O2 sat was 87%.  He was started on auto CPAP from 4 to 20cm H2O.    He tells me that he stopped using his device for a while but then got a new mask and now is back to using it for the past 4 weeks and feels so much better using it. He is doing well with his PAP device and thinks that he has gotten used to it.  He tolerates the mask and feels the pressure is adequate.  Since going on PAP he feels rested in the am but sill has some daytime sleepiness at times.  He denies any mouth or nose dryness.  think that he snores.   Prior CV studies:   The following studies were reviewed today:  PAP compliance download and sleep study  Past Medical History:  Diagnosis Date   Aortic root dilation (HCC)    Arthritis    OA- R knee, hands    ED (erectile dysfunction)    Hepatitis 1975   A viral   High cholesterol    Mild CAD    a. by cardiac CT 08/2018.   Obesity    OSA (obstructive sleep apnea) 04/15/2015   Severe OSA with AHI 35.2 and successful CPAP titration to 9cmH2O, needs CPAP, will continue to explore    Seasonal allergies    Past Surgical History:  Procedure Laterality Date   CERVICAL FUSION  1999   COLON RESECTION Right 07/29/2017   Procedure: POSSIBLE RIGHT COLON  RESECTION;  Surgeon: Alphonsa Overall, MD;  Location: [facility];  Service: General;  Laterality: Right;   COLONOSCOPY WITH PROPOFOL  04/19/2012   Procedure: COLONOSCOPY WITH PROPOFOL;  Surgeon: Jeryl Columbia, MD;  Location: [facility];  Service: Endoscopy;  Laterality: N/A;   COLONOSCOPY WITH PROPOFOL N/A 01/17/2015   Procedure: COLONOSCOPY WITH PROPOFOL;  Surgeon: Clarene Essex, MD;  Location: [facility];  Service: Endoscopy;  Laterality: N/A;   HOT HEMOSTASIS  04/19/2012   Procedure: HOT HEMOSTASIS (ARGON PLASMA COAGULATION/BICAP);  Surgeon: Jeryl Columbia, MD;  Location: [facility];  Service: Endoscopy;  Laterality: N/A;   HOT HEMOSTASIS N/A 01/17/2015   Procedure: HOT HEMOSTASIS (ARGON PLASMA COAGULATION/BICAP);  Surgeon: Clarene Essex, MD;  Location: [facility];  Service: Endoscopy;  Laterality: N/A;   IR RADIOLOGIST EVAL & MGMT  05/25/2017   IR RADIOLOGIST EVAL & MGMT  06/08/2017   LAPAROSCOPIC APPENDECTOMY N/A 07/29/2017   Procedure: APPENDECTOMY LAPAROSCOPIC ERAS PATHWAY;  Surgeon: Alphonsa Overall, MD;  Location: [facility];  Service: General;  Laterality: N/A;   [place]     Current Meds  Medication Sig  acetaminophen (TYLENOL) 325 MG tablet Take 650 mg by mouth every 6 (six) hours as needed for moderate pain or headache.   amoxicillin (AMOXIL) 500 MG tablet Take 2,000 mg by mouth as directed. For dental appointments   aspirin 81 MG tablet Take 81 mg by mouth daily.   carvedilol (COREG) 25 MG tablet Take 1 tablet (25 mg total) by mouth 2 (two) times daily.   cetirizine (ZYRTEC) 10 MG tablet Take 10 mg by mouth daily.   fluticasone (FLONASE) 50 MCG/ACT nasal spray Place 1 spray into both nostrils in the morning.   Multiple Vitamin (MULTIVITAMIN WITH MINERALS) TABS Take 1 tablet by mouth daily.   NON FORMULARY Pt uses a cpap nightly   rosuvastatin (CRESTOR) 10 MG tablet Take 1 tablet (10 mg total) by mouth daily.     Allergies:   Patient has no known allergies.   Social History    Tobacco Use   Smoking status: Former    Packs/day: 1.00    Years: 3.00    Total pack years: 3.00    Types: Cigarettes    Quit date: 07/14/1987    Years since quitting: 34.4   Smokeless tobacco: Never  Vaping Use   Vaping Use: Never used  Substance Use Topics   Alcohol use: Yes    Comment: rare   Drug use: No     Family Hx: The patient's family history includes Heart attack in his brother, brother, father, and mother; Heart disease in his brother and brother; Hyperlipidemia in his father and mother; Hypertension in his sister.  ROS:   Please see the history of present illness.     All other systems reviewed and are negative.   Labs/Other Tests and Data Reviewed:    Recent Labs: No results found for requested labs within last 365 days.   Recent Lipid Panel No results found for: "CHOL", "TRIG", "HDL", "CHOLHDL", "LDLCALC", "LDLDIRECT"  Wt Readings from Last 3 Encounters:  12/23/21 238 lb 3.2 oz (108 kg)  10/29/21 236 lb 6.4 oz (107.2 kg)  10/02/20 227 lb 12.8 oz (103.3 kg)     Objective:    Vital Signs:  BP 130/74   Pulse 63   Ht '5\' 10"'$  (1.778 m)   Wt 238 lb 3.2 oz (108 kg)   SpO2 97%   BMI 34.18 kg/m   GEN: Well nourished, well developed in no acute distress HEENT: Normal NECK: No JVD; No carotid bruits LYMPHATICS: No lymphadenopathy CARDIAC:RRR, no murmurs, rubs, gallops RESPIRATORY:  Clear to auscultation without rales, wheezing or rhonchi  ABDOMEN: Soft, non-tender, non-distended MUSCULOSKELETAL:  No edema; No deformity  SKIN: Warm and dry NEUROLOGIC:  Alert and oriented x 3 PSYCHIATRIC:  Normal affect   ASSESSMENT & PLAN:    1.  OSA - The patient is tolerating PAP therapy well without any problems. The PAP download performed by his DME was personally reviewed and interpreted by me today and showed an AHI of 1/hr on auto CPAP with 70% compliance in using more than 4 hours nightly.  The patient has been using and benefiting from PAP use and will  continue to benefit from therapy.   2.  HTN -BP controlled on exam today -continue prescription drug management with Carvedilol '25mg'$  BID with PRN refills    Medication Adjustments/Labs and Tests Ordered: Current medicines are reviewed at length with the patient today.  Concerns regarding medicines are outlined above.  Tests Ordered: No orders of the defined types were placed in this encounter.  Medication Changes: No orders of the defined types were placed in this encounter.   Disposition:  Follow up in 1 year(s)  Signed, Fransico Him, MD  12/23/2021 8:41 AM    [facility]

## 2021-12-23 NOTE — Patient Instructions (Signed)
SURGICAL WAITING ROOM VISITATION Patients having surgery or a procedure may have no more than 2 support people in the waiting area - these visitors may rotate.   Children under the age of 42 must have an adult with them who is not the patient. If the patient needs to stay at the hospital during part of their recovery, the visitor guidelines for inpatient rooms apply. Pre-op nurse will coordinate an appropriate time for 1 support person to accompany patient in pre-op.  This support person may not rotate.    Please refer to the [facility] website for the visitor guidelines for Inpatients (after your surgery is over and you are in a regular room).       Your procedure is scheduled on:  01/06/22    Report to [facility] Main Entrance    Report to admitting at  [place] AM   Call this number if you have problems the morning of surgery [phone]   Do not eat food :After Midnight.   After Midnight you may have the following liquids until ___ 0430___ AM DAY OF SURGERY  Water Non-Citrus Juices (without pulp, NO RED) Carbonated Beverages Black Coffee (NO MILK/CREAM OR CREAMERS, sugar ok)  Clear Tea (NO MILK/CREAM OR CREAMERS, sugar ok) regular and decaf                             Plain Jell-O (NO RED)                                           Fruit ices (not with fruit pulp, NO RED)                                     Popsicles (NO RED)                                                               Sports drinks like Gatorade (NO RED)                   The day of surgery:  Drink ONE (1) Pre-Surgery Clear Ensure or G2 at 0430  AM ( have completed by )  the morning of surgery. Drink in one sitting. Do not sip.  This drink was given to you during your hospital  pre-op appointment visit. Nothing else to drink after completing the  Pre-Surgery Clear Ensure or G2.          If you have questions, please contact your surgeon's office.       Oral Hygiene is also important to  reduce your risk of infection.                                    Remember - BRUSH YOUR TEETH THE MORNING OF SURGERY WITH YOUR REGULAR TOOTHPASTE   Do NOT smoke after Midnight   Take these medicines the morning of surgery with A SIP OF WATER:  coreg, zyrtec, flonase   DO NOT  TAKE ANY ORAL DIABETIC MEDICATIONS DAY OF YOUR SURGERY  Bring CPAP mask and tubing day of surgery.                              You may not have any metal on your body including hair pins, jewelry, and body piercing             Do not wear make-up, lotions, powders, perfumes/cologne, or deodorant  Do not wear nail polish including gel and S&S, artificial/acrylic nails, or any other type of covering on natural nails including finger and toenails. If you have artificial nails, gel coating, etc. that needs to be removed by a nail salon please have this removed prior to surgery or surgery may need to be canceled/ delayed if the surgeon/ anesthesia feels like they are unable to be safely monitored.   Do not shave  48 hours prior to surgery.               Men may shave face and neck.   Do not bring valuables to the hospital. [place].   Contacts, dentures or bridgework may not be worn into surgery.   Bring small overnight bag day of surgery.   DO NOT [place]. PHARMACY WILL DISPENSE MEDICATIONS LISTED ON YOUR MEDICATION LIST TO YOU DURING YOUR ADMISSION [place]!    Patients discharged on the day of surgery will not be allowed to drive home.  Someone NEEDS to stay with you for the first 24 hours after anesthesia.   Special Instructions: Bring a copy of your healthcare power of attorney and living will documents the day of surgery if you haven't scanned them before.              Please read over the following fact sheets you were given: IF [place] [phone]   If you  received a COVID test during your pre-op visit  it is requested that you wear a mask when out in public, stay away from anyone that may not be feeling well and notify your surgeon if you develop symptoms. If you test positive for Covid or have been in contact with anyone that has tested positive in the last 10 days please notify you surgeon.     [place] - Preparing for Surgery Before surgery, you can play an important role.  Because skin is not sterile, your skin needs to be as free of germs as possible.  You can reduce the number of germs on your skin by washing with CHG (chlorahexidine gluconate) soap before surgery.  CHG is an antiseptic cleaner which kills germs and bonds with the skin to continue killing germs even after washing. Please DO NOT use if you have an allergy to CHG or antibacterial soaps.  If your skin becomes reddened/irritated stop using the CHG and inform your nurse when you arrive at Short Stay. Do not shave (including legs and underarms) for at least 48 hours prior to the first CHG shower.  You may shave your face/neck. Please follow these instructions carefully:  1.  Shower with CHG Soap the night before surgery and the  morning of Surgery.  2.  If you choose to wash your hair, wash your hair first as usual with your  normal  shampoo.  3.  After you shampoo, rinse your hair and body thoroughly to remove the  shampoo.                           4.  Use CHG as you would any other liquid soap.  You can apply chg directly  to the skin and wash                       Gently with a scrungie or clean washcloth.  5.  Apply the CHG Soap to your body ONLY FROM THE NECK DOWN.   Do not use on face/ open                           Wound or open sores. Avoid contact with eyes, ears mouth and genitals (private parts).                       Wash face,  Genitals (private parts) with your normal soap.             6.  Wash thoroughly, paying special attention to the area where your surgery  will  be performed.  7.  Thoroughly rinse your body with warm water from the neck down.  8.  DO NOT shower/wash with your normal soap after using and rinsing off  the CHG Soap.                9.  Pat yourself dry with a clean towel.            10.  Wear clean pajamas.            11.  Place clean sheets on your bed the night of your first shower and do not  sleep with pets. Day of Surgery : Do not apply any lotions/deodorants the morning of surgery.  Please wear clean clothes to the hospital/surgery center.  FAILURE TO FOLLOW THESE INSTRUCTIONS MAY RESULT IN THE CANCELLATION OF YOUR SURGERY PATIENT SIGNATURE_________________________________  NURSE SIGNATURE__________________________________  ________________________________________________________________________

## 2021-12-23 NOTE — Patient Instructions (Signed)
Medication Instructions:  Your physician recommends that you continue on your current medications as directed. Please refer to the Current Medication list given to you today.  *If you need a refill on your cardiac medications before your next appointment, please call your pharmacy*  Follow-Up: At [facility], you and your health needs are our priority.  As part of our continuing mission to provide you with exceptional heart care, we have created designated Provider Care Teams.  These Care Teams include your primary Cardiologist (physician) and Advanced Practice Providers (APPs -  Physician Assistants and Nurse Practitioners) who all work together to provide you with the care you need, when you need it.  Your next appointment:   1 year(s)  The format for your next appointment:   In Person  Provider:   Traci Turner, MD     Important Information About Sugar       

## 2021-12-24 ENCOUNTER — Other Ambulatory Visit: Payer: Self-pay

## 2021-12-24 ENCOUNTER — Encounter (HOSPITAL_COMMUNITY)
Admission: RE | Admit: 2021-12-24 | Discharge: 2021-12-24 | Disposition: A | Discharge status: Home / Self Care | Payer: No Typology Code available for payment source | Source: Ambulatory Visit | Attending: Orthopedic Surgery | Admitting: Orthopedic Surgery

## 2021-12-24 ENCOUNTER — Encounter (HOSPITAL_COMMUNITY): Payer: Self-pay

## 2021-12-24 VITALS — BP 153/80 | Temp 98.2°F | Resp 16 | Ht 70.0 in | Wt 238.1 lb

## 2021-12-24 DIAGNOSIS — Z87891 Personal history of nicotine dependence: Secondary | ICD-10-CM | POA: Insufficient documentation

## 2021-12-24 DIAGNOSIS — Z01818 Encounter for other preprocedural examination: Secondary | ICD-10-CM | POA: Insufficient documentation

## 2021-12-24 DIAGNOSIS — M1712 Unilateral primary osteoarthritis, left knee: Secondary | ICD-10-CM | POA: Diagnosis not present

## 2021-12-24 DIAGNOSIS — G4733 Obstructive sleep apnea (adult) (pediatric): Secondary | ICD-10-CM | POA: Diagnosis not present

## 2021-12-24 DIAGNOSIS — I712 Thoracic aortic aneurysm, without rupture, unspecified: Secondary | ICD-10-CM | POA: Diagnosis not present

## 2021-12-24 DIAGNOSIS — Z6834 Body mass index (BMI) 34.0-34.9, adult: Secondary | ICD-10-CM | POA: Diagnosis not present

## 2021-12-24 DIAGNOSIS — E669 Obesity, unspecified: Secondary | ICD-10-CM | POA: Diagnosis not present

## 2021-12-24 DIAGNOSIS — I10 Essential (primary) hypertension: Secondary | ICD-10-CM | POA: Diagnosis not present

## 2021-12-24 HISTORY — DX: Essential (primary) hypertension: I10

## 2021-12-24 LAB — CBC
HCT: 38 % — ABNORMAL LOW (ref 39.0–52.0)
Hemoglobin: 12.8 g/dL — ABNORMAL LOW (ref 13.0–17.0)
MCH: 32.2 pg (ref 26.0–34.0)
MCHC: 33.7 g/dL (ref 30.0–36.0)
MCV: 95.7 fL (ref 80.0–100.0)
Platelets: 204 10*3/uL (ref 150–400)
RBC: 3.97 MIL/uL — ABNORMAL LOW (ref 4.22–5.81)
RDW: 12.8 % (ref 11.5–15.5)
WBC: 5.9 10*3/uL (ref 4.0–10.5)
nRBC: 0 % (ref 0.0–0.2)

## 2021-12-24 LAB — COMPREHENSIVE METABOLIC PANEL
ALT: 22 U/L (ref 0–44)
AST: 28 U/L (ref 15–41)
Albumin: 4.1 g/dL (ref 3.5–5.0)
Alkaline Phosphatase: 61 U/L (ref 38–126)
Anion gap: 5 (ref 5–15)
BUN: 25 mg/dL — ABNORMAL HIGH (ref 8–23)
CO2: 26 mmol/L (ref 22–32)
Calcium: 9.1 mg/dL (ref 8.9–10.3)
Chloride: 109 mmol/L (ref 98–111)
Creatinine, Ser: 0.99 mg/dL (ref 0.61–1.24)
GFR, Estimated: >60 mL/min (ref >60)
Glucose, Bld: 90 mg/dL (ref 70–99)
Potassium: 4.5 mmol/L (ref 3.5–5.1)
Sodium: 140 mmol/L (ref 135–145)
Total Bilirubin: 0.5 mg/dL (ref 0.3–1.2)
Total Protein: 6.9 g/dL (ref 6.5–8.1)

## 2021-12-24 LAB — SURGICAL PCR SCREEN
MRSA, PCR: NEGATIVE
Staphylococcus aureus: POSITIVE — AB

## 2021-12-25 NOTE — Progress Notes (Signed)
PCR: + STAPH °

## 2021-12-25 NOTE — Progress Notes (Signed)
Anesthesia Chart Review   Case: [id number] Date/Time: 01/06/22 0715   Procedure: TOTAL KNEE ARTHROPLASTY (Left: Knee)   Anesthesia type: Spinal   Pre-op diagnosis: OA LEFT KNEE   Location: [facility] ROOM 09 / [facility]   Surgeons: Willaim Sheng, MD       DISCUSSION:66 y.o. former smoker with h/o OSA, HTN, thoracic aortic aneurysm stable on MRA, left knee OA scheduled for above procedure 01/06/2022 with Dr. Charlies Constable.   Per cardiology preoperative evaluation 11/17/2021, "Chart reviewed as part of pre-operative protocol coverage. 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    Aspirin may be held for 7 days prior to surgery and should be resumed as soon as possible postoperatively."  Anticipate pt can proceed with planned procedure barring acute status change.     VS: BP (!) 153/80   Temp 36.8 C (Oral)   Resp 16   Ht '5\' 10"'$  (1.778 m)   Wt 108 kg   SpO2 99%   BMI 34.16 kg/m   PROVIDERS: Shirline Frees, MD is PCP   Cardiologist:  Casandra Doffing, MD  LABS: Labs reviewed: Acceptable for surgery. (all labs ordered are listed, but only abnormal results are displayed)  Labs Reviewed  SURGICAL PCR SCREEN - Abnormal; Notable for the following components:      Result Value   Staphylococcus aureus POSITIVE (*)    All other components within normal limits  COMPREHENSIVE METABOLIC PANEL - Abnormal; Notable for the following components:   BUN 25 (*)    All other components within normal limits  CBC - Abnormal; Notable for the following components:   RBC 3.97 (*)    Hemoglobin 12.8 (*)    HCT 38.0 (*)    All other components within normal limits     IMAGES: MR Angio Chest 10/01/2021 IMPRESSION: Stable uncomplicated mild fusiform aneurysmal dilatation of the aortic root and ascending thoracic aorta measuring 50 mm and 43 mm in  diameter respectively, grossly unchanged compared to the 06/2013 examination by my direct remeasurement. Aortic aneurysm NOS (ICD10-I71.9).  EKG:   CV: Echo 12/21/2018 1. Left ventricular ejection fraction, by visual estimation, is 55 to  60%. The left ventricle has normal function. Normal left ventricular size.  There is no left ventricular hypertrophy. Normal wall motion. Abnormal GLS  -14.5%.   2. Left ventricular diastolic Doppler parameters are consistent with  impaired relaxation pattern of LV diastolic filling.   3. The mitral valve is normal in structure. Trace mitral valve  regurgitation. No evidence of mitral stenosis.   4. The tricuspid valve is normal in structure. Tricuspid valve  regurgitation is trivial.   5. The aortic valve is tricuspid Aortic valve regurgitation is trivial by  color flow Doppler   6. There is mild dilatation of the ascending aorta measuring 41 mm.   7. Left atrial size was mildly dilated.   8. Right atrial size was normal.   9. The inferior vena cava is normal in size with greater than 50%  respiratory variability, suggesting right atrial pressure of 3 mmHg.  10. Global right ventricle has normal systolic function.The right  ventricular size is normal. No increase in right ventricular wall  thickness.  11. The tricuspid regurgitant velocity is 2.47 m/s, and with an assumed  right atrial pressure of 3 mmHg, the estimated right ventricular systolic  pressure is normal at 27.4  mmHg.  Past Medical History:  Diagnosis Date   Aortic root dilation (HCC)    Arthritis    OA- R knee, hands    ED (erectile dysfunction)    Hepatitis 1975   A viral   High cholesterol    Hypertension    Mild CAD    a. by cardiac CT 08/2018.   Obesity    OSA (obstructive sleep apnea) 04/15/2015   Severe OSA with AHI 35.2 and successful CPAP titration to 9cmH2O, needs CPAP, will continue to explore    Seasonal allergies     Past Surgical History:  Procedure  Laterality Date   CERVICAL FUSION  03/15/1997   COLON RESECTION Right 07/29/2017   Procedure: POSSIBLE RIGHT COLON RESECTION;  Surgeon: Alphonsa Overall, MD;  Location: [facility];  Service: General;  Laterality: Right;   COLONOSCOPY WITH PROPOFOL  04/19/2012   Procedure: COLONOSCOPY WITH PROPOFOL;  Surgeon: Jeryl Columbia, MD;  Location: [facility];  Service: Endoscopy;  Laterality: N/A;   COLONOSCOPY WITH PROPOFOL N/A 01/17/2015   Procedure: COLONOSCOPY WITH PROPOFOL;  Surgeon: Clarene Essex, MD;  Location: [facility];  Service: Endoscopy;  Laterality: N/A;   HOT HEMOSTASIS  04/19/2012   Procedure: HOT HEMOSTASIS (ARGON PLASMA COAGULATION/BICAP);  Surgeon: Jeryl Columbia, MD;  Location: [facility];  Service: Endoscopy;  Laterality: N/A;   HOT HEMOSTASIS N/A 01/17/2015   Procedure: HOT HEMOSTASIS (ARGON PLASMA COAGULATION/BICAP);  Surgeon: Clarene Essex, MD;  Location: [facility];  Service: Endoscopy;  Laterality: N/A;   IR RADIOLOGIST EVAL & MGMT  05/25/2017   IR RADIOLOGIST EVAL & MGMT  06/08/2017   LAPAROSCOPIC APPENDECTOMY N/A 07/29/2017   Procedure: APPENDECTOMY LAPAROSCOPIC ERAS PATHWAY;  Surgeon: Alphonsa Overall, MD;  Location: [facility];  Service: General;  Laterality: N/A;   NASAL SEPTUM SURGERY  03/15/1996   right knee replacement       MEDICATIONS:  acetaminophen (TYLENOL) 325 MG tablet   amoxicillin (AMOXIL) 500 MG tablet   aspirin 81 MG tablet   carvedilol (COREG) 25 MG tablet   cetirizine (ZYRTEC) 10 MG tablet   fluticasone (FLONASE) 50 MCG/ACT nasal spray   Multiple Vitamin (MULTIVITAMIN WITH MINERALS) TABS   NON FORMULARY   rosuvastatin (CRESTOR) 10 MG tablet   No current facility-administered medications for this encounter.     Daniel Felix Ward, PA-C [facility]Surgical [facility] [phone]

## 2021-12-25 NOTE — Anesthesia Preprocedure Evaluation (Addendum)
Anesthesia Evaluation  Patient identified by MRN, date of birth, ID band Patient awake    Reviewed: Allergy & Precautions, NPO status , Patient's Chart, lab work & pertinent test results  History of Anesthesia Complications Negative for: history of anesthetic complications  Airway Mallampati: III  TM Distance: >3 FB Neck ROM: Full    Dental  (+) Dental Advisory Given,    Pulmonary neg shortness of breath, sleep apnea and Continuous Positive Airway Pressure Ventilation , neg COPD, neg recent URI, former smoker,    breath sounds clear to auscultation       Cardiovascular hypertension, Pt. on home beta blockers and Pt. on medications (-) angina+ Peripheral Vascular Disease  (-) Past MI and (-) CHF  Rhythm:Regular  1. Left ventricular ejection fraction, by visual estimation, is 55 to  60%. The left ventricle has normal function. Normal left ventricular size.  There is no left ventricular hypertrophy. Normal wall motion. Abnormal GLS  -14.5%.  2. Left ventricular diastolic Doppler parameters are consistent with  impaired relaxation pattern of LV diastolic filling.  3. The mitral valve is normal in structure. Trace mitral valve  regurgitation. No evidence of mitral stenosis.  4. The tricuspid valve is normal in structure. Tricuspid valve  regurgitation is trivial.  5. The aortic valve is tricuspid Aortic valve regurgitation is trivial by  color flow Doppler  6. There is mild dilatation of the ascending aorta measuring 41 mm.  7. Left atrial size was mildly dilated.  8. Right atrial size was normal.  9. The inferior vena cava is normal in size with greater than 50%  respiratory variability, suggesting right atrial pressure of 3 mmHg.  10. Global right ventricle has normal systolic function.The right  ventricular size is normal. No increase in right ventricular wall  thickness.  11. The tricuspid regurgitant velocity is 2.47  m/s, and with an assumed  right atrial pressure of 3 mmHg, the estimated right ventricular systolic  pressure is normal at 27.4 mmHg.    Neuro/Psych negative neurological ROS  negative psych ROS   GI/Hepatic negative GI ROS, Neg liver ROS, Lab Results      Component                Value               Date                      ALT                      22                  12/24/2021                AST                      28                  12/24/2021                ALKPHOS                  61                  12/24/2021                BILITOT  0.5                 12/24/2021              Endo/Other  negative endocrine ROS  Renal/GU negative Renal ROS     Musculoskeletal  (+) Arthritis ,   Abdominal   Peds  Hematology negative hematology ROS (+) Lab Results      Component                Value               Date                      WBC                      5.9                 12/24/2021                HGB                      12.8 (L)            12/24/2021                HCT                      38.0 (L)            12/24/2021                MCV                      95.7                12/24/2021                PLT                      204                 12/24/2021            Lab Results      Component                Value               Date                      INR                      1.12                05/10/2017                INR                      1.09                05/06/2017              Anesthesia Other Findings   Reproductive/Obstetrics                            Anesthesia Physical Anesthesia Plan  ASA: 2  Anesthesia Plan: MAC, Regional and Spinal   Post-op  Pain Management: Celebrex PO (pre-op)* and Tylenol PO (pre-op)*   Induction:   PONV Risk Score and Plan: 1 and Propofol infusion and Treatment may vary due to age or medical condition  Airway Management Planned: Nasal Cannula and Natural  Airway  Additional Equipment: None  Intra-op Plan:   Post-operative Plan:   Informed Consent: I have reviewed the patients History and Physical, chart, labs and discussed the procedure including the risks, benefits and alternatives for the proposed anesthesia with the patient or authorized representative who has indicated his/her understanding and acceptance.     Dental advisory given  Plan Discussed with: CRNA  Anesthesia Plan Comments: (See PAT note 12/24/2021)       Anesthesia Quick Evaluation

## 2022-01-05 ENCOUNTER — Encounter (HOSPITAL_COMMUNITY): Payer: Self-pay | Admitting: Orthopedic Surgery

## 2022-01-06 ENCOUNTER — Observation Stay (HOSPITAL_COMMUNITY): Payer: No Typology Code available for payment source

## 2022-01-06 ENCOUNTER — Encounter (HOSPITAL_COMMUNITY): Payer: Self-pay | Admitting: Orthopedic Surgery

## 2022-01-06 ENCOUNTER — Ambulatory Visit (HOSPITAL_COMMUNITY): Payer: No Typology Code available for payment source | Admitting: Physician Assistant

## 2022-01-06 ENCOUNTER — Ambulatory Visit (HOSPITAL_BASED_OUTPATIENT_CLINIC_OR_DEPARTMENT_OTHER): Payer: No Typology Code available for payment source | Admitting: Anesthesiology

## 2022-01-06 ENCOUNTER — Other Ambulatory Visit: Payer: Self-pay

## 2022-01-06 ENCOUNTER — Encounter (HOSPITAL_COMMUNITY)
Admission: RE | Disposition: A | Discharge status: Home / Self Care | Payer: Self-pay | Source: Home / Self Care | Attending: Orthopedic Surgery

## 2022-01-06 ENCOUNTER — Observation Stay (HOSPITAL_COMMUNITY)
Admission: RE | Admit: 2022-01-06 | Discharge: 2022-01-07 | Disposition: A | Discharge status: Home / Self Care | Payer: No Typology Code available for payment source | Attending: Orthopedic Surgery | Admitting: Orthopedic Surgery

## 2022-01-06 DIAGNOSIS — M1712 Unilateral primary osteoarthritis, left knee: Principal | ICD-10-CM | POA: Diagnosis present

## 2022-01-06 DIAGNOSIS — Z01818 Encounter for other preprocedural examination: Secondary | ICD-10-CM

## 2022-01-06 DIAGNOSIS — Z79899 Other long term (current) drug therapy: Secondary | ICD-10-CM | POA: Insufficient documentation

## 2022-01-06 DIAGNOSIS — Z7982 Long term (current) use of aspirin: Secondary | ICD-10-CM | POA: Diagnosis not present

## 2022-01-06 DIAGNOSIS — I2510 Atherosclerotic heart disease of native coronary artery without angina pectoris: Secondary | ICD-10-CM | POA: Insufficient documentation

## 2022-01-06 DIAGNOSIS — Z87891 Personal history of nicotine dependence: Secondary | ICD-10-CM | POA: Diagnosis not present

## 2022-01-06 DIAGNOSIS — I10 Essential (primary) hypertension: Secondary | ICD-10-CM | POA: Insufficient documentation

## 2022-01-06 HISTORY — PX: TOTAL KNEE ARTHROPLASTY: SHX125

## 2022-01-06 SURGERY — ARTHROPLASTY, KNEE, TOTAL
Anesthesia: Monitor Anesthesia Care | Site: Knee | Laterality: Left

## 2022-01-06 MED ORDER — ACETAMINOPHEN 160 MG/5ML PO SOLN: 1000.0000 mg | Freq: Once | ORAL | Status: DC | PRN

## 2022-01-06 MED ORDER — ACETAMINOPHEN 325 MG PO TABS
325.0000 mg | ORAL_TABLET | Freq: Four times a day (QID) | ORAL | Status: DC | PRN
  Administered 2022-01-06: 650 mg via ORAL
  Filled 2022-01-06: qty 2

## 2022-01-06 MED ORDER — OXYCODONE HCL 5 MG PO TABS
5.0000 mg | ORAL_TABLET | ORAL | Status: DC | PRN
  Administered 2022-01-06: 5 mg via ORAL
  Filled 2022-01-06 (×2): qty 2

## 2022-01-06 MED ORDER — POLYETHYLENE GLYCOL 3350 17 G PO PACK: 17.0000 g | PACK | Freq: Every day | ORAL | Status: DC | PRN

## 2022-01-06 MED ORDER — SODIUM CHLORIDE 0.9% FLUSH
INTRAVENOUS | Status: DC | PRN
  Administered 2022-01-06: 60 mL

## 2022-01-06 MED ORDER — HYDROMORPHONE HCL 1 MG/ML IJ SOLN
INTRAMUSCULAR | Status: AC
  Filled 2022-01-06: qty 1

## 2022-01-06 MED ORDER — MENTHOL 3 MG MT LOZG: 1.0000 | LOZENGE | OROMUCOSAL | Status: DC | PRN

## 2022-01-06 MED ORDER — KETOROLAC TROMETHAMINE 15 MG/ML IJ SOLN
15.0000 mg | Freq: Four times a day (QID) | INTRAMUSCULAR | Status: AC
  Administered 2022-01-06 – 2022-01-07 (×4): 15 mg via INTRAVENOUS
  Filled 2022-01-06 (×3): qty 1

## 2022-01-06 MED ORDER — PHENYLEPHRINE HCL-NACL 20-0.9 MG/250ML-% IV SOLN
INTRAVENOUS | Status: DC | PRN
  Administered 2022-01-06: 15 ug/min via INTRAVENOUS

## 2022-01-06 MED ORDER — BUPIVACAINE-EPINEPHRINE (PF) 0.5% -1:200000 IJ SOLN
INTRAMUSCULAR | Status: DC | PRN
  Administered 2022-01-06: 20 mL via PERINEURAL

## 2022-01-06 MED ORDER — SODIUM CHLORIDE 0.9 % IR SOLN
Status: DC | PRN
  Administered 2022-01-06: 3000 mL

## 2022-01-06 MED ORDER — PROPOFOL 1000 MG/100ML IV EMUL
INTRAVENOUS | Status: AC
  Filled 2022-01-06: qty 100

## 2022-01-06 MED ORDER — ONDANSETRON HCL 4 MG/2ML IJ SOLN
INTRAMUSCULAR | Status: AC
  Filled 2022-01-06: qty 4

## 2022-01-06 MED ORDER — LACTATED RINGERS IV SOLN: INTRAVENOUS | Status: DC

## 2022-01-06 MED ORDER — ZOLPIDEM TARTRATE 5 MG PO TABS: 5.0000 mg | ORAL_TABLET | Freq: Every evening | ORAL | Status: DC | PRN

## 2022-01-06 MED ORDER — CARVEDILOL 25 MG PO TABS
25.0000 mg | ORAL_TABLET | Freq: Two times a day (BID) | ORAL | Status: DC
  Administered 2022-01-06 – 2022-01-07 (×2): 25 mg via ORAL
  Filled 2022-01-06 (×2): qty 1

## 2022-01-06 MED ORDER — ACETAMINOPHEN 10 MG/ML IV SOLN: 1000.0000 mg | Freq: Once | INTRAVENOUS | Status: DC | PRN

## 2022-01-06 MED ORDER — ACETAMINOPHEN 500 MG PO TABS: 1000.0000 mg | ORAL_TABLET | Freq: Once | ORAL | Status: DC | PRN

## 2022-01-06 MED ORDER — CEFAZOLIN SODIUM-DEXTROSE 2-4 GM/100ML-% IV SOLN
2.0000 g | Freq: Four times a day (QID) | INTRAVENOUS | Status: AC
  Administered 2022-01-06 (×2): 2 g via INTRAVENOUS
  Filled 2022-01-06 (×2): qty 100

## 2022-01-06 MED ORDER — PHENOL 1.4 % MT LIQD: 1.0000 | OROMUCOSAL | Status: DC | PRN

## 2022-01-06 MED ORDER — 0.9 % SODIUM CHLORIDE (POUR BTL) OPTIME
TOPICAL | Status: DC | PRN
  Administered 2022-01-06: 1000 mL

## 2022-01-06 MED ORDER — ROSUVASTATIN CALCIUM 10 MG PO TABS
10.0000 mg | ORAL_TABLET | Freq: Every day | ORAL | Status: DC
  Administered 2022-01-07: 10 mg via ORAL
  Filled 2022-01-06: qty 1

## 2022-01-06 MED ORDER — SODIUM CHLORIDE (PF) 0.9 % IJ SOLN
INTRAMUSCULAR | Status: AC
  Filled 2022-01-06: qty 10

## 2022-01-06 MED ORDER — DOCUSATE SODIUM 100 MG PO CAPS
100.0000 mg | ORAL_CAPSULE | Freq: Two times a day (BID) | ORAL | Status: DC
  Administered 2022-01-06 – 2022-01-07 (×2): 100 mg via ORAL
  Filled 2022-01-06 (×2): qty 1

## 2022-01-06 MED ORDER — POVIDONE-IODINE 10 % EX SWAB: 2.0000 | Freq: Once | CUTANEOUS | Status: DC

## 2022-01-06 MED ORDER — ACETAMINOPHEN 500 MG PO TABS
1000.0000 mg | ORAL_TABLET | Freq: Once | ORAL | Status: AC
  Administered 2022-01-06: 1000 mg via ORAL
  Filled 2022-01-06: qty 2

## 2022-01-06 MED ORDER — FENTANYL CITRATE PF 50 MCG/ML IJ SOSY
PREFILLED_SYRINGE | INTRAMUSCULAR | Status: AC
  Administered 2022-01-06: 50 ug via INTRAVENOUS
  Filled 2022-01-06: qty 2

## 2022-01-06 MED ORDER — FENTANYL CITRATE PF 50 MCG/ML IJ SOSY
25.0000 ug | PREFILLED_SYRINGE | INTRAMUSCULAR | Status: DC | PRN
  Administered 2022-01-06: 50 ug via INTRAVENOUS

## 2022-01-06 MED ORDER — LIDOCAINE 2% (20 MG/ML) 5 ML SYRINGE
INTRAMUSCULAR | Status: DC | PRN
  Administered 2022-01-06: 50 mg via INTRAVENOUS

## 2022-01-06 MED ORDER — BUPIVACAINE LIPOSOME 1.3 % IJ SUSP
INTRAMUSCULAR | Status: DC | PRN
  Administered 2022-01-06: 20 mL

## 2022-01-06 MED ORDER — OXYCODONE HCL 5 MG PO TABS
ORAL_TABLET | ORAL | Status: AC
  Administered 2022-01-06: 5 mg via ORAL
  Filled 2022-01-06: qty 1

## 2022-01-06 MED ORDER — OXYCODONE HCL 5 MG PO TABS: 5.0000 mg | ORAL_TABLET | Freq: Once | ORAL | Status: AC | PRN

## 2022-01-06 MED ORDER — ISOPROPYL ALCOHOL 70 % SOLN
Status: DC | PRN
  Administered 2022-01-06: 1 via TOPICAL

## 2022-01-06 MED ORDER — METHOCARBAMOL 500 MG IVPB - SIMPLE MED
INTRAVENOUS | Status: AC
  Administered 2022-01-06: 500 mg via INTRAVENOUS
  Filled 2022-01-06: qty 55

## 2022-01-06 MED ORDER — BUPIVACAINE IN DEXTROSE 0.75-8.25 % IT SOLN
INTRATHECAL | Status: DC | PRN
  Administered 2022-01-06: 2 mL via INTRATHECAL

## 2022-01-06 MED ORDER — FENTANYL CITRATE (PF) 100 MCG/2ML IJ SOLN
INTRAMUSCULAR | Status: AC
  Filled 2022-01-06: qty 2

## 2022-01-06 MED ORDER — ONDANSETRON HCL 4 MG/2ML IJ SOLN
INTRAMUSCULAR | Status: DC | PRN
  Administered 2022-01-06: 4 mg via INTRAVENOUS

## 2022-01-06 MED ORDER — PROPOFOL 10 MG/ML IV BOLUS
INTRAVENOUS | Status: AC
  Filled 2022-01-06: qty 20

## 2022-01-06 MED ORDER — OXYCODONE HCL 5 MG/5ML PO SOLN: 5.0000 mg | Freq: Once | ORAL | Status: AC | PRN

## 2022-01-06 MED ORDER — KETOROLAC TROMETHAMINE 15 MG/ML IJ SOLN
INTRAMUSCULAR | Status: AC
  Filled 2022-01-06: qty 1

## 2022-01-06 MED ORDER — PANTOPRAZOLE SODIUM 40 MG PO TBEC
40.0000 mg | DELAYED_RELEASE_TABLET | Freq: Every day | ORAL | Status: DC
  Administered 2022-01-06 – 2022-01-07 (×2): 40 mg via ORAL
  Filled 2022-01-06 (×2): qty 1

## 2022-01-06 MED ORDER — FENTANYL CITRATE (PF) 100 MCG/2ML IJ SOLN
INTRAMUSCULAR | Status: DC | PRN
  Administered 2022-01-06 (×2): 50 ug via INTRAVENOUS

## 2022-01-06 MED ORDER — ISOPROPYL ALCOHOL 70 % SOLN
Status: AC
  Filled 2022-01-06: qty 480

## 2022-01-06 MED ORDER — OXYCODONE HCL 5 MG PO TABS
10.0000 mg | ORAL_TABLET | ORAL | Status: DC | PRN
  Administered 2022-01-06 – 2022-01-07 (×2): 10 mg via ORAL
  Filled 2022-01-06: qty 2

## 2022-01-06 MED ORDER — MIDAZOLAM HCL 2 MG/2ML IJ SOLN
INTRAMUSCULAR | Status: AC
  Filled 2022-01-06: qty 2

## 2022-01-06 MED ORDER — CHLORHEXIDINE GLUCONATE 0.12 % MT SOLN
15.0000 mL | Freq: Once | OROMUCOSAL | Status: AC
  Administered 2022-01-06: 15 mL via OROMUCOSAL

## 2022-01-06 MED ORDER — BUPIVACAINE LIPOSOME 1.3 % IJ SUSP: 20.0000 mL | Freq: Once | INTRAMUSCULAR | Status: DC

## 2022-01-06 MED ORDER — DEXAMETHASONE SODIUM PHOSPHATE 10 MG/ML IJ SOLN
INTRAMUSCULAR | Status: DC | PRN
  Administered 2022-01-06: 4 mg via INTRAVENOUS

## 2022-01-06 MED ORDER — BUPIVACAINE LIPOSOME 1.3 % IJ SUSP
INTRAMUSCULAR | Status: AC
  Filled 2022-01-06: qty 20

## 2022-01-06 MED ORDER — ASPIRIN 81 MG PO CHEW
81.0000 mg | CHEWABLE_TABLET | Freq: Two times a day (BID) | ORAL | Status: DC
  Administered 2022-01-07: 81 mg via ORAL
  Filled 2022-01-06: qty 1

## 2022-01-06 MED ORDER — TRANEXAMIC ACID-NACL 1000-0.7 MG/100ML-% IV SOLN
1000.0000 mg | INTRAVENOUS | Status: AC
  Administered 2022-01-06: 1000 mg via INTRAVENOUS
  Filled 2022-01-06: qty 100

## 2022-01-06 MED ORDER — METHOCARBAMOL 500 MG IVPB - SIMPLE MED
500.0000 mg | Freq: Four times a day (QID) | INTRAVENOUS | Status: DC | PRN
  Filled 2022-01-06: qty 55

## 2022-01-06 MED ORDER — HYDROMORPHONE HCL 1 MG/ML IJ SOLN: 0.5000 mg | INTRAMUSCULAR | Status: DC | PRN

## 2022-01-06 MED ORDER — MIDAZOLAM HCL 5 MG/5ML IJ SOLN
INTRAMUSCULAR | Status: DC | PRN
  Administered 2022-01-06 (×2): 1 mg via INTRAVENOUS

## 2022-01-06 MED ORDER — PROPOFOL 10 MG/ML IV BOLUS
INTRAVENOUS | Status: DC | PRN
  Administered 2022-01-06: 20 mg via INTRAVENOUS

## 2022-01-06 MED ORDER — ONDANSETRON HCL 4 MG PO TABS: 4.0000 mg | ORAL_TABLET | Freq: Four times a day (QID) | ORAL | Status: DC | PRN

## 2022-01-06 MED ORDER — POVIDONE-IODINE 10 % EX SWAB
2.0000 | Freq: Once | CUTANEOUS | Status: AC
  Administered 2022-01-06: 2 via TOPICAL

## 2022-01-06 MED ORDER — CELECOXIB 200 MG PO CAPS
400.0000 mg | ORAL_CAPSULE | Freq: Once | ORAL | Status: AC
  Administered 2022-01-06: 400 mg via ORAL
  Filled 2022-01-06: qty 2

## 2022-01-06 MED ORDER — METHOCARBAMOL 500 MG PO TABS: 500.0000 mg | ORAL_TABLET | Freq: Four times a day (QID) | ORAL | Status: DC | PRN

## 2022-01-06 MED ORDER — PROPOFOL 500 MG/50ML IV EMUL
INTRAVENOUS | Status: DC | PRN
  Administered 2022-01-06: 75 ug/kg/min via INTRAVENOUS

## 2022-01-06 MED ORDER — WATER FOR IRRIGATION, STERILE IR SOLN
Status: DC | PRN
  Administered 2022-01-06: 2000 mL

## 2022-01-06 MED ORDER — ACETAMINOPHEN 500 MG PO TABS
1000.0000 mg | ORAL_TABLET | Freq: Four times a day (QID) | ORAL | Status: DC
  Administered 2022-01-06 – 2022-01-07 (×2): 1000 mg via ORAL
  Filled 2022-01-06 (×2): qty 2

## 2022-01-06 MED ORDER — SODIUM CHLORIDE (PF) 0.9 % IJ SOLN
INTRAMUSCULAR | Status: AC
  Filled 2022-01-06: qty 50

## 2022-01-06 MED ORDER — OXYCODONE HCL 5 MG PO TABS
ORAL_TABLET | ORAL | Status: AC
  Filled 2022-01-06: qty 1

## 2022-01-06 MED ORDER — DIPHENHYDRAMINE HCL 12.5 MG/5ML PO ELIX: 12.5000 mg | ORAL_SOLUTION | ORAL | Status: DC | PRN

## 2022-01-06 MED ORDER — DEXAMETHASONE SODIUM PHOSPHATE 10 MG/ML IJ SOLN: 8.0000 mg | Freq: Once | INTRAMUSCULAR | Status: DC

## 2022-01-06 MED ORDER — ONDANSETRON HCL 4 MG/2ML IJ SOLN: 4.0000 mg | Freq: Four times a day (QID) | INTRAMUSCULAR | Status: DC | PRN

## 2022-01-06 MED ORDER — DEXAMETHASONE SODIUM PHOSPHATE 10 MG/ML IJ SOLN
INTRAMUSCULAR | Status: AC
  Filled 2022-01-06: qty 2

## 2022-01-06 MED ORDER — CEFAZOLIN SODIUM-DEXTROSE 2-4 GM/100ML-% IV SOLN
2.0000 g | INTRAVENOUS | Status: AC
  Administered 2022-01-06: 2 g via INTRAVENOUS
  Filled 2022-01-06: qty 100

## 2022-01-06 MED ORDER — ORAL CARE MOUTH RINSE: 15.0000 mL | Freq: Once | OROMUCOSAL | Status: AC

## 2022-01-06 MED ORDER — LIDOCAINE HCL (PF) 2 % IJ SOLN
INTRAMUSCULAR | Status: AC
  Filled 2022-01-06: qty 10

## 2022-01-06 SURGICAL SUPPLY — 58 items
BAG COUNTER SPONGE SURGICOUNT (BAG) IMPLANT
BLADE SAG 18X100X1.27 (BLADE) ×1 IMPLANT
BLADE SAW SAG 35X64 .89 (BLADE) ×1 IMPLANT
BNDG COHESIVE 3X5 TAN ST LF (GAUZE/BANDAGES/DRESSINGS) ×1 IMPLANT
BNDG ELASTIC 6X10 VLCR STRL LF (GAUZE/BANDAGES/DRESSINGS) ×1 IMPLANT
BOWL SMART MIX CTS (DISPOSABLE) ×1 IMPLANT
CEMENT BONE R 1X40 (Cement) IMPLANT
CHLORAPREP W/TINT 26 (MISCELLANEOUS) ×2 IMPLANT
COMP FEM CMT PERS SZ 11 LT (Joint) ×1 IMPLANT
COMPONENT FEM CMT PERS SZ11LT (Joint) IMPLANT
COVER SURGICAL LIGHT HANDLE (MISCELLANEOUS) ×1 IMPLANT
CUFF TOURN SGL QUICK 34 (TOURNIQUET CUFF) ×1
CUFF TRNQT CYL 34X4.125X (TOURNIQUET CUFF) ×1 IMPLANT
DERMABOND ADVANCED .7 DNX12 (GAUZE/BANDAGES/DRESSINGS) ×1 IMPLANT
DRAPE INCISE IOBAN 85X60 (DRAPES) ×1 IMPLANT
DRAPE SHEET LG 3/4 BI-LAMINATE (DRAPES) ×1 IMPLANT
DRAPE U-SHAPE 47X51 STRL (DRAPES) ×1 IMPLANT
DRESSING AQUACEL AG SP 3.5X10 (GAUZE/BANDAGES/DRESSINGS) ×1 IMPLANT
DRSG AQUACEL AG SP 3.5X10 (GAUZE/BANDAGES/DRESSINGS) ×1
ELECT REM PT RETURN 15FT ADLT (MISCELLANEOUS) ×1 IMPLANT
GAUZE SPONGE 4X4 12PLY STRL (GAUZE/BANDAGES/DRESSINGS) ×1 IMPLANT
GLOVE BIOGEL PI IND STRL 8 (GLOVE) ×1 IMPLANT
GLOVE SURG ORTHO 8.0 STRL STRW (GLOVE) ×2 IMPLANT
GOWN STRL REUS W/ TWL XL LVL3 (GOWN DISPOSABLE) ×1 IMPLANT
GOWN STRL REUS W/TWL XL LVL3 (GOWN DISPOSABLE) ×1
HANDPIECE INTERPULSE COAX TIP (DISPOSABLE) ×1
HDLS TROCR DRIL PIN KNEE 75 (PIN) ×1
HOLDER FOLEY CATH W/STRAP (MISCELLANEOUS) ×1 IMPLANT
HOOD PEEL AWAY FLYTE STAYCOOL (MISCELLANEOUS) ×3 IMPLANT
INSERT ARTISURF S8-11 18X22X14 (Insert) IMPLANT
MANIFOLD NEPTUNE II (INSTRUMENTS) ×1 IMPLANT
MARKER SKIN DUAL TIP RULER LAB (MISCELLANEOUS) ×1 IMPLANT
NS IRRIG 1000ML POUR BTL (IV SOLUTION) ×1 IMPLANT
PACK TOTAL KNEE CUSTOM (KITS) ×1 IMPLANT
PIN DRILL HDLS TROCAR 75 4PK (PIN) IMPLANT
PROTECTOR NERVE ULNAR (MISCELLANEOUS) ×1 IMPLANT
SCREW HEADED 33MM KNEE (MISCELLANEOUS) IMPLANT
SET HNDPC FAN SPRY TIP SCT (DISPOSABLE) ×1 IMPLANT
SOLUTION IRRIG SURGIPHOR (IV SOLUTION) IMPLANT
SPIKE FLUID TRANSFER (MISCELLANEOUS) ×1 IMPLANT
STEM POLY PAT PLY 38M KNEE (Knees) IMPLANT
STEM TIBIA 5 DEG SZ G L KNEE (Knees) IMPLANT
STRIP CLOSURE SKIN 1/2X4 (GAUZE/BANDAGES/DRESSINGS) ×1 IMPLANT
SUT MNCRL AB 3-0 PS2 18 (SUTURE) ×1 IMPLANT
SUT STRATAFIX 0 PDS 27 VIOLET (SUTURE) ×1
SUT STRATAFIX PDO 1 14 VIOLET (SUTURE)
SUT STRATFX PDO 1 14 VIOLET (SUTURE)
SUT VIC AB 1 CT1 36 (SUTURE) IMPLANT
SUT VIC AB 2-0 CT2 27 (SUTURE) ×2 IMPLANT
SUTURE STRATFX 0 PDS 27 VIOLET (SUTURE) ×1 IMPLANT
SUTURE STRATFX PDO 1 14 VIOLET (SUTURE) ×1 IMPLANT
SYR 50ML LL SCALE MARK (SYRINGE) ×1 IMPLANT
TIBIA STEM 5 DEG SZ G L KNEE (Knees) ×1 IMPLANT
TRAY FOLEY MTR SLVR 14FR STAT (SET/KITS/TRAYS/PACK) IMPLANT
TRAY FOLEY MTR SLVR 16FR STAT (SET/KITS/TRAYS/PACK) IMPLANT
TUBE SUCTION HIGH CAP CLEAR NV (SUCTIONS) ×1 IMPLANT
UNDERPAD 30X36 HEAVY ABSORB (UNDERPADS AND DIAPERS) ×1 IMPLANT
WRAP KNEE MAXI GEL POST OP (GAUZE/BANDAGES/DRESSINGS) IMPLANT

## 2022-01-06 NOTE — Interval H&P Note (Signed)
The patient has been re-examined, and the chart reviewed, and there have been no interval changes to the documented history and physical.    Plan for L TKA for left knee OA  The operative side was examined and the patient was confirmed to have sensation to DPN, SPN, TN intact, Motor EHL, ext, flex 5/5, and DP 2+, PT 2+, No significant edema.   The risks, benefits, and alternatives have been discussed at length with patient, and the patient is willing to proceed.  Left knee marked. Consent has been signed.  

## 2022-01-06 NOTE — Discharge Instructions (Signed)

## 2022-01-06 NOTE — Evaluation (Signed)
Physical Therapy Evaluation Patient Details Name: Daniel Humphrey MRN: [id number] DOB: 25-Oct-1955 Today's Date: 01/06/2022  History of Present Illness  Patient is 66 y.o. male s/p Lt TKA on 01/06/22 with PMH significant for OA, HTN, CAD, OSA, Cervical fusion, Rt colectomy in 2019, Rt TKA.    Clinical Impression  Daniel Humphrey is a 66 y.o. male POD 0 s/p Lt TKA. Patient reports independence with mobility at baseline. Patient is now limited by functional impairments (see PT problem list below) and requires min assist for transfers and gait with RW. Patient was able to ambulate ~50 feet with RW and min assist. Patient instructed in exercise to facilitate ROM and circulation to manage edema. Patient will benefit from continued skilled PT interventions to address impairments and progress towards PLOF. Acute PT will follow to progress mobility and stair training in preparation for safe discharge home.        Recommendations for follow up therapy are one component of a multi-disciplinary discharge planning process, led by the attending physician.  Recommendations may be updated based on patient status, additional functional criteria and insurance authorization.  Follow Up Recommendations Follow physician's recommendations for discharge plan and follow up therapies      Assistance Recommended at Discharge Intermittent Supervision/Assistance  Patient can return home with the following  A little help with walking and/or transfers;A little help with bathing/dressing/bathroom;Assistance with cooking/housework;Direct supervision/assist for medications management;Assist for transportation;Help with stairs or ramp for entrance    Equipment Recommendations Rolling walker (2 wheels)  Recommendations for Other Services       Functional Status Assessment Patient has had a recent decline in their functional status and demonstrates the ability to make significant improvements in function in a  reasonable and predictable amount of time.     Precautions / Restrictions Precautions Precautions: Fall Restrictions Weight Bearing Restrictions: No      Mobility  Bed Mobility Overal bed mobility: Needs Assistance Bed Mobility: Supine to Sit     Supine to sit: Min assist, HOB elevated     General bed mobility comments: cues for sequence/technique to use bed rail and assist to fully pivot LE's off EOB and raise trunk.    Transfers Overall transfer level: Needs assistance Equipment used: Rolling walker (2 wheels) Transfers: Sit to/from Stand Sit to Stand: Min assist           General transfer comment: cues for hand placement on EOB for power up, min assist to rise fully and steady once standing.    Ambulation/Gait Ambulation/Gait assistance: Min assist, Min guard Gait Distance (Feet): 50 Feet Assistive device: Rolling walker (2 wheels) Gait Pattern/deviations: Step-to pattern, Decreased stride length, Decreased stance time - left, Decreased weight shift to left, Shuffle Gait velocity: decr     General Gait Details: cues for step pattern and proximitiy to RW, pt progressed from min assist to min guard.  Stairs            Wheelchair Mobility    Modified Rankin (Stroke Patients Only)       Balance Overall balance assessment: Needs assistance Sitting-balance support: Feet supported Sitting balance-Leahy Scale: Good     Standing balance support: Reliant on assistive device for balance, During functional activity, Bilateral upper extremity supported Standing balance-Leahy Scale: Poor                               Pertinent Vitals/Pain Pain Assessment Pain Assessment: 0-10 Pain Score:  3  Pain Location: Lt knee Pain Descriptors / Indicators: Aching, Discomfort Pain Intervention(s): Limited activity within patient's tolerance, Repositioned, Monitored during session    [place] expects to be discharged to:: Private  residence Living Arrangements: Spouse/significant other Available Help at Discharge: Family Type of Home: House Home Access: Stairs to enter Entrance Stairs-Rails: None Technical brewer of Steps: 1   Home Layout: One level Home Equipment: Shower seat - built in;Grab bars - tub/shower;Hand held shower head;Cane - single point;Rolling Environmental consultant (2 wheels) (old RW)      Prior Function Prior Level of Function : Independent/Modified Independent                     Hand Dominance   Dominant Hand: Right    Extremity/Trunk Assessment   Upper Extremity Assessment Upper Extremity Assessment: Overall WFL for tasks assessed    Lower Extremity Assessment Lower Extremity Assessment: Overall WFL for tasks assessed;LLE deficits/detail LLE Deficits / Details: good quad activation, no extensor lag with SLR LLE Sensation: WNL LLE Coordination: WNL    Cervical / Trunk Assessment Cervical / Trunk Assessment: Normal  Communication   Communication: No difficulties  Cognition Arousal/Alertness: Awake/alert Behavior During Therapy: WFL for tasks assessed/performed Overall Cognitive Status: Within Functional Limits for tasks assessed                                          General Comments      Exercises Total Joint Exercises Ankle Circles/Pumps: AROM, Both, 20 reps Quad Sets: AROM, Left, 5 reps Heel Slides: AROM, Left, 5 reps   Assessment/Plan    PT Assessment Patient needs continued PT services  PT Problem List Decreased strength;Decreased range of motion;Decreased activity tolerance;Decreased balance;Decreased mobility;Decreased knowledge of use of DME;Decreased safety awareness;Decreased knowledge of precautions       PT Treatment Interventions DME instruction;Gait training;Stair training;Functional mobility training;Therapeutic activities;Therapeutic exercise;Balance training;Patient/family education    PT Goals (Current goals can be found in the  Care Plan section)  Acute Rehab PT Goals Patient Stated Goal: get back to independent and camping more PT Goal Formulation: With patient/family Time For Goal Achievement: 01/13/22 Potential to Achieve Goals: Good    Frequency 7X/week     Co-evaluation               AM-PAC PT "6 Clicks" Mobility  Outcome Measure Help needed turning from your back to your side while in a flat bed without using bedrails?: A Little Help needed moving from lying on your back to sitting on the side of a flat bed without using bedrails?: A Little Help needed moving to and from a bed to a chair (including a wheelchair)?: A Little Help needed standing up from a chair using your arms (e.g., wheelchair or bedside chair)?: A Little Help needed to walk in hospital room?: A Little Help needed climbing 3-5 steps with a railing? : A Lot 6 Click Score: 17    End of Session Equipment Utilized During Treatment: Gait belt Activity Tolerance: Patient tolerated treatment well Patient left: in chair;with call bell/phone within reach;with chair alarm set;with family/visitor present Nurse Communication: Mobility status PT Visit Diagnosis: Unsteadiness on feet (R26.81);Muscle weakness (generalized) (M62.81);Difficulty in walking, not elsewhere classified (R26.2)    Time: 3875-6433 PT Time Calculation (min) (ACUTE ONLY): 24 min   Charges:   PT Evaluation $PT Eval Low Complexity: 1 Low PT  Treatments $Gait Training: 8-22 mins        Verner Mould, DPT Acute Rehabilitation Services Office [phone]  01/06/22 3:47 PM

## 2022-01-06 NOTE — Op Note (Signed)
DATE OF SURGERY:  01/06/2022 TIME: 9:09 AM  PATIENT NAME:  Daniel Humphrey   AGE: 66 y.o.    PRE-OPERATIVE DIAGNOSIS: End-stage left knee osteoarthritis  POST-OPERATIVE DIAGNOSIS:  Same  PROCEDURE: Left total Knee Arthroplasty  SURGEON:  Valena Ivanov A Kayn Haymore, MD   ASSISTANT: Izola Price, RNFA, present and scrubbed throughout the case, critical for assistance with exposure, retraction, instrumentation, and closure.   OPERATIVE IMPLANTS:  Cemented Zimmer persona 11 standard femur, G tibia baseplate, 38 mm patella poly, 10 mm MC poly Implant Name Type Inv. Item Serial No. Manufacturer Lot No. LRB No. Used Action  CEMENT BONE R 1X40 - [id number] Cement CEMENT BONE R 1X40  [employer](ORTH,TRAU,BIO,SG) [id number] Left 1 Implanted  CEMENT BONE R 1X40 - [id number] Cement CEMENT BONE R 1X40  [employer](ORTH,TRAU,BIO,SG) [id number] Left 1 Implanted  INSERT ARTISURF S8-11 [id number] - [id number] Insert INSERT ARTISURF S8-11 [id number]  ZIMMER [employer]ON(ORTH,TRAU,BIO,SG) [id number] Left 1 Implanted  STEM POLY PAT PLY 26M KNEE - [id number] Knees STEM POLY PAT PLY 26M KNEE  ZIMMER [employer]ON(ORTH,TRAU,BIO,SG) [id number] Left 1 Implanted  TIBIA STEM 5 DEG SZ G L KNEE - [id number] Knees TIBIA STEM 5 DEG SZ G L KNEE  ZIMMER [employer]ON(ORTH,TRAU,BIO,SG) [id number] Left 1 Implanted  COMP FEM CMT PERS SZ 11 LT - [id number] Joint COMP FEM CMT PERS SZ 11 LT  ZIMMER [employer]ON(ORTH,TRAU,BIO,SG) [id number] Left 1 Implanted      PREOPERATIVE INDICATIONS:  Daniel Humphrey is a 66 y.o. year old male with end stage bone on bone degenerative arthritis of the knee who failed conservative treatment, including injections, antiinflammatories, activity modification, and assistive devices, and had significant impairment of their activities of daily living, and elected for Total Knee Arthroplasty.   The risks, benefits, and alternatives were discussed at length including but not limited to the risks of infection, bleeding, nerve injury,  stiffness, blood clots, the need for revision surgery, cardiopulmonary complications, among others, and they were willing to proceed.  ESTIMATED BLOOD LOSS: 25cc  OPERATIVE DESCRIPTION:   Once adequate anesthesia was induced, preoperative antibiotics, 2 gm of ancef,1 gm of Tranexamic Acid, and 8 mg of Decadron administered, the patient was positioned supine with a left thigh tourniquet placed.  The left lower extremity was prepped and draped in sterile fashion.  A time-  out was performed identifying the patient, planned procedure, and the appropriate extremity.     The leg was  exsanguinated, tourniquet elevated to 250 mmHg.  A midline incision was  made followed by median parapatellar arthrotomy. Anterior horn of the medial meniscus was released and resected. A medial release was performed, the infrapatellar fat pad was resected with care taken to protect the patellar tendon. The suprapatellar fat was removed to exposed the distal anterior femur. The anterior horn of the lateral meniscus and ACL were released.    Following initial  exposure, I first started with the femur  The femoral  canal was opened with a drill, canal was suctioned to try to prevent fat emboli.  An  intramedullary rod was passed set at 5 degrees valgus, 11 mm. The distal femur was resected.  Following this resection, the tibia was  subluxated anteriorly.  Using the extramedullary guide, 10 mm of bone was resected off   the proximal lateral tibia.  We confirmed the gap would be  stable medially and laterally with a size 71m spacer block as well as confirmed that the tibial cut was perpendicular in the coronal plane, checking with an alignment rod.  Once this was done, the posterior femoral referencing femoral sizer was placed under to the posterior condyles with 5 degrees of external rotational which was parallel to the transepicondylar axis and perpendicular to [employer]. The femur was sized to be a size 11 in the  anterior-  posterior dimension. The  anterior, posterior, and  chamfer cuts were made without difficulty nor   notching making certain that I was along the anterior cortex to help  with flexion gap stability. Next a laminar spreader was placed with the knee in flexion and the medial lateral menisci were resected.  5 cc of the Exparel mixture was injected in the medial side of the back of the knee and 3 cc in the lateral side.  1/2 inch curved osteotome was used to resect posterior osteophyte that was then removed with a pituitary rongeur.       At this point, the tibia was sized to be a size G.  The size G tray was  then pinned in position. Trial reduction was now carried with a 11 femur, G tibia, a 10 mm MC insert.  The knee had full extension and was stable to varus valgus stress in extension.  The knee was tight in flexion so the PCL was completely released which improved flexion stability  Attention was next directed to the patella.  Precut  measurement was noted to be 27 mm.  I resected down to 15 mm and used a  38 patellar button to restore patellar height as well as cover the cut surface.     The patella lug holes were drilled and a 38 mm patella poly trial was placed.    The knee was brought to full extension with good flexion stability with the patella tracking through the trochlea without application of pressure.     Next the femoral component was again assessed and determined to be seated and appropriately lateralized.  The femoral lug holes were drilled.  The femoral component was then removed. Tibial component was again assessed and felt to be seated and appropriately rotated with the medial third of the tubercle. The tibia was then drilled, and keel punched.     Final components were  opened and regular cement was mixed.      Final implants were then  cemented onto cleaned and dried cut surfaces of bone with the knee brought to extension with a 10 mm MC poly.  The knee was irrigated  with sterile Betadine diluted in saline as well as pulse lavage normal saline.  The synovial lining was  then injected a dilute Exparel.      Once the cement had fully cured, excess cement was removed throughout the knee.  I confirmed that I was satisfied with the range of motion and stability, and the final 10 mm MC poly insert was chosen.  It was placed into the knee.         The tourniquet had been let down.  No significant hemostasis was required.  The medial parapatellar arthrotomy was then reapproximated using #1 Vicryl and #1 Stratafix sutures with the knee  in flexion.  The remaining wound was closed with 0 stratafix, 2-0 Vicryl, and running 3-0 Monocryl. The knee was cleaned, dried, dressed sterilely using Dermabond and   Aquacel dressing.  The patient was then brought to recovery room in stable condition, tolerating the procedure  well. There were no complications.   Post op recs: WB: WBAT Abx: ancef Imaging: PACU xrays DVT prophylaxis:  Aspirin '81mg'$  BID x4 weeks Follow up: 2 weeks after surgery for a wound check with Dr. Zachery Dakins at [facility].  Address: [address], [place], [place] [address]  Office Phone: [phone]  Charlies Constable, MD Orthopaedic Surgery

## 2022-01-06 NOTE — Transfer of Care (Signed)
Immediate Anesthesia Transfer of Care Note  Patient: [name]  Procedure(s) Performed: Procedure(s): TOTAL KNEE ARTHROPLASTY (Left)  Patient Location: PACU  Anesthesia Type:Spinal  Level of Consciousness:  sedated, patient cooperative and responds to stimulation  Airway & Oxygen Therapy:Patient Spontanous Breathing and Patient connected to face mask oxgen  Post-op Assessment:  Report given to PACU RN and Post -op Vital signs reviewed and stable  Post vital signs:  Reviewed and stable  Last Vitals:  Vitals:   01/06/22 0541  BP: (!) 153/81  Pulse: 72  Resp: 20  Temp: 36.7 C  SpO2: 04%    Complications: No apparent anesthesia complications

## 2022-01-06 NOTE — Anesthesia Postprocedure Evaluation (Signed)
Anesthesia Post Note  Patient: Elliott Lasecki Aure  Procedure(s) Performed: TOTAL KNEE ARTHROPLASTY (Left: Knee)     Patient location during evaluation: PACU Anesthesia Type: Regional, MAC and Spinal Level of consciousness: awake and alert Pain management: pain level controlled Vital Signs Assessment: post-procedure vital signs reviewed and stable Respiratory status: spontaneous breathing, nonlabored ventilation and respiratory function stable Cardiovascular status: stable and blood pressure returned to baseline Postop Assessment: no apparent nausea or vomiting Anesthetic complications: no   No notable events documented.  Last Vitals:  Vitals:   01/06/22 1330 01/06/22 1353  BP: (!) 148/84 (!) 161/84  Pulse: 78 72  Resp: 18 16  Temp:  (!) 36.4 C  SpO2: 97% 95%    Last Pain:  Vitals:   01/06/22 1353  TempSrc: Oral  PainSc:                  Stefanos Haynesworth

## 2022-01-06 NOTE — Progress Notes (Signed)
Orthopedic Tech Progress Note Patient Details:  Daniel Humphrey [facility] 1955/11/17 [id number]  Patient ID: Daniel Humphrey, male   DOB: 18-Jan-1956, 66 y.o.   MRN: [id number]  Daniel Humphrey 01/06/2022, 9:47 AM Bone foam applied to left leg in pacu

## 2022-01-06 NOTE — Anesthesia Procedure Notes (Signed)
Anesthesia Regional Block: Adductor canal block   Pre-Anesthetic Checklist: , timeout performed,  Correct Patient, Correct Site, Correct Laterality,  Correct Procedure, Correct Position, site marked,  Risks and benefits discussed,  Surgical consent,  Pre-op evaluation,  At surgeon's request and post-op pain management  Laterality: Lower and Left  Prep: chloraprep       Needles:  Injection technique: Single-shot      Needle Length: 9cm  Needle Gauge: 22     Additional Needles: Arrow StimuQuik ECHO Echogenic Stimulating PNB Needle  Procedures:,,,, ultrasound used (permanent image in chart),,    Narrative:  Start time: 01/06/2022 7:01 AM End time: 01/06/2022 7:08 AM Injection made incrementally with aspirations every 5 mL.  Performed by: Personally  Anesthesiologist: Oleta Mouse, MD

## 2022-01-06 NOTE — Anesthesia Procedure Notes (Signed)
Spinal  Patient location during procedure: OR Start time: 01/06/2022 7:24 AM End time: 01/06/2022 7:34 AM Reason for block: surgical anesthesia Staffing Performed: anesthesiologist  Anesthesiologist: Oleta Mouse, MD Performed by: Oleta Mouse, MD Authorized by: Oleta Mouse, MD   Preanesthetic Checklist Completed: patient identified, IV checked, risks and benefits discussed, surgical consent, monitors and equipment checked, pre-op evaluation and timeout performed Spinal Block Patient position: sitting Prep: DuraPrep Patient monitoring: heart rate, cardiac monitor, continuous pulse ox and blood pressure Approach: midline Location: L4-5 Injection technique: single-shot Needle Needle type: Pencan  Needle gauge: 24 G Needle length: 9 cm Assessment Sensory level: T6 Events: CSF return

## 2022-01-07 ENCOUNTER — Encounter (HOSPITAL_COMMUNITY): Payer: Self-pay | Admitting: Orthopedic Surgery

## 2022-01-07 DIAGNOSIS — M1712 Unilateral primary osteoarthritis, left knee: Secondary | ICD-10-CM | POA: Diagnosis not present

## 2022-01-07 LAB — BASIC METABOLIC PANEL
Anion gap: 7 (ref 5–15)
BUN: 18 mg/dL (ref 8–23)
CO2: 23 mmol/L (ref 22–32)
Calcium: 8.6 mg/dL — ABNORMAL LOW (ref 8.9–10.3)
Chloride: 111 mmol/L (ref 98–111)
Creatinine, Ser: 1.06 mg/dL (ref 0.61–1.24)
GFR, Estimated: >60 mL/min (ref >60)
Glucose, Bld: 150 mg/dL — ABNORMAL HIGH (ref 70–99)
Potassium: 4.5 mmol/L (ref 3.5–5.1)
Sodium: 141 mmol/L (ref 135–145)

## 2022-01-07 LAB — CBC
HCT: 32.7 % — ABNORMAL LOW (ref 39.0–52.0)
Hemoglobin: 11 g/dL — ABNORMAL LOW (ref 13.0–17.0)
MCH: 32.2 pg (ref 26.0–34.0)
MCHC: 33.6 g/dL (ref 30.0–36.0)
MCV: 95.6 fL (ref 80.0–100.0)
Platelets: 160 10*3/uL (ref 150–400)
RBC: 3.42 MIL/uL — ABNORMAL LOW (ref 4.22–5.81)
RDW: 12.9 % (ref 11.5–15.5)
WBC: 9.3 10*3/uL (ref 4.0–10.5)
nRBC: 0 % (ref 0.0–0.2)

## 2022-01-07 MED ORDER — OXYCODONE HCL 5 MG PO TABS: 5.0000 mg | ORAL_TABLET | ORAL | 0 refills | Status: AC | PRN

## 2022-01-07 MED ORDER — ASPIRIN 81 MG PO TABS: 81.0000 mg | ORAL_TABLET | Freq: Two times a day (BID) | ORAL | 0 refills | Status: AC

## 2022-01-07 MED ORDER — METHOCARBAMOL 500 MG PO TABS: 500.0000 mg | ORAL_TABLET | Freq: Three times a day (TID) | ORAL | 0 refills | Status: AC | PRN

## 2022-01-07 MED ORDER — CELECOXIB 100 MG PO CAPS: 100.0000 mg | ORAL_CAPSULE | Freq: Two times a day (BID) | ORAL | 0 refills | Status: AC

## 2022-01-07 MED ORDER — ONDANSETRON HCL 4 MG PO TABS: 4.0000 mg | ORAL_TABLET | Freq: Three times a day (TID) | ORAL | 0 refills | Status: AC | PRN

## 2022-01-07 NOTE — Plan of Care (Signed)
Pt ready for Dc home with wife.

## 2022-01-07 NOTE — Discharge Summary (Signed)
Physician Discharge Summary  Patient ID: Daniel Humphrey MRN: [id number] DOB/AGE: 10/17/1955 66 y.o.  Admit date: 01/06/2022 Discharge date: 01/07/2022  Admission Diagnoses:  Localized osteoarthritis of left knee  Discharge Diagnoses:  Principal Problem:   Localized osteoarthritis of left knee   Past Medical History:  Diagnosis Date   Aortic root dilation (HCC)    Arthritis    OA- R knee, hands    ED (erectile dysfunction)    Hepatitis 1975   A viral   High cholesterol    Hypertension    Mild CAD    a. by cardiac CT 08/2018.   Obesity    OSA (obstructive sleep apnea) 04/15/2015   Severe OSA with AHI 35.2 and successful CPAP titration to 9cmH2O, needs CPAP, will continue to explore    Seasonal allergies     Surgeries: Procedure(s): TOTAL KNEE ARTHROPLASTY on 01/06/2022   Consultants (if any):   Discharged Condition: Improved  Hospital Course: Daniel Humphrey is an 66 y.o. male who was admitted 01/06/2022 with a diagnosis of Localized osteoarthritis of left knee and went to the operating room on 01/06/2022 and underwent the above named procedures.    He was given perioperative antibiotics:  Anti-infectives (From admission, onward)    Start     Dose/Rate Route Frequency Ordered Stop   01/06/22 1500  ceFAZolin (ANCEF) IVPB 2g/100 mL premix        2 g 200 mL/hr over 30 Minutes Intravenous Every 6 hours 01/06/22 1406 01/06/22 2156   01/06/22 0600  ceFAZolin (ANCEF) IVPB 2g/100 mL premix        2 g 200 mL/hr over 30 Minutes Intravenous On call to O.R. 01/06/22 0533 01/06/22 0732     .  He was given sequential compression devices, early ambulation, and aspirin for DVT prophylaxis.  He benefited maximally from the hospital stay and there were no complications.    Recent vital signs:  Vitals:   01/07/22 0118 01/07/22 0512  BP: 121/62 (!) 131/53  Pulse: 83 75  Resp: 18 18  Temp: 98.1 F (36.7 C) 97.6 F (36.4 C)  SpO2: 94% 97%    Recent laboratory  studies:  Lab Results  Component Value Date   HGB 11.0 (L) 01/07/2022   HGB 12.8 (L) 12/24/2021   HGB 13.6 07/25/2017   Lab Results  Component Value Date   WBC 9.3 01/07/2022   PLT 160 01/07/2022   Lab Results  Component Value Date   INR 1.12 05/10/2017   Lab Results  Component Value Date   NA 141 01/07/2022   K 4.5 01/07/2022   CL 111 01/07/2022   CO2 23 01/07/2022   BUN 18 01/07/2022   CREATININE 1.06 01/07/2022   GLUCOSE 150 (H) 01/07/2022    Discharge Medications:   Allergies as of 01/07/2022   No Known Allergies      Medication List     TAKE these medications    acetaminophen 325 MG tablet Commonly known as: TYLENOL Take 650 mg by mouth every 6 (six) hours as needed for moderate pain or headache.   amoxicillin 500 MG tablet Commonly known as: AMOXIL Take 2,000 mg by mouth as directed. For dental appointments   aspirin 81 MG tablet Take 1 tablet (81 mg total) by mouth in the morning and at bedtime. What changed: when to take this   carvedilol 25 MG tablet Commonly known as: COREG Take 1 tablet (25 mg total) by mouth 2 (two) times daily.   celecoxib 100  MG capsule Commonly known as: CeleBREX Take 1 capsule (100 mg total) by mouth 2 (two) times daily for 14 days.   cetirizine 10 MG tablet Commonly known as: ZYRTEC Take 10 mg by mouth daily.   fluticasone 50 MCG/ACT nasal spray Commonly known as: FLONASE Place 1 spray into both nostrils in the morning.   methocarbamol 500 MG tablet Commonly known as: ROBAXIN Take 1 tablet (500 mg total) by mouth every 8 (eight) hours as needed for up to 10 days for muscle spasms.   multivitamin with minerals Tabs tablet Take 1 tablet by mouth daily.   NON FORMULARY Pt uses a cpap nightly   ondansetron 4 MG tablet Commonly known as: Zofran Take 1 tablet (4 mg total) by mouth every 8 (eight) hours as needed for up to 14 days for nausea or vomiting.   oxyCODONE 5 MG immediate release tablet Commonly known  as: Roxicodone Take 1 tablet (5 mg total) by mouth every 4 (four) hours as needed for up to 7 days for severe pain or moderate pain.   rosuvastatin 10 MG tablet Commonly known as: CRESTOR Take 1 tablet (10 mg total) by mouth daily.        Diagnostic Studies: DG Knee Left Port  Result Date: 01/06/2022 CLINICAL DATA:  Postop knee replacement EXAM: PORTABLE LEFT KNEE - 1-2 VIEW COMPARISON:  None Available. FINDINGS: Two-view show total knee arthroplasty. Components appear well positioned. No radiographically detectable complication. Air and fluid in the joint as expected IMPRESSION: Good appearance following total knee arthroplasty. Electronically Signed   By: Nelson Chimes M.D.   On: 01/06/2022 10:09    Disposition: Discharge disposition: 01-Home or Self Care       Discharge Instructions     Call MD / Call 911   Complete by: As directed    If you experience chest pain or shortness of breath, CALL 911 and be transported to the hospital emergency room.  If you develope a fever above 101 F, pus (white drainage) or increased drainage or redness at the wound, or calf pain, call your surgeon's office.   Constipation Prevention   Complete by: As directed    Drink plenty of fluids.  Prune juice may be helpful.  You may use a stool softener, such as Colace (over the counter) 100 mg twice a day.  Use MiraLax (over the counter) for constipation as needed.   Diet - low sodium heart healthy   Complete by: As directed    Do not put a pillow under the knee. Place it under the heel.   Complete by: As directed    Increase activity slowly as tolerated   Complete by: As directed    Post-operative opioid taper instructions:   Complete by: As directed    POST-OPERATIVE OPIOID TAPER INSTRUCTIONS: It is important to wean off of your opioid medication as soon as possible. If you do not need pain medication after your surgery it is ok to stop day one. Opioids include: Codeine, Hydrocodone(Norco,  Vicodin), Oxycodone(Percocet, oxycontin) and hydromorphone amongst others.  Long term and even short term use of opiods can cause: Increased pain response Dependence Constipation Depression Respiratory depression And more.  Withdrawal symptoms can include Flu like symptoms Nausea, vomiting And more Techniques to manage these symptoms Hydrate well Eat regular healthy meals Stay active Use relaxation techniques(deep breathing, meditating, yoga) Do Not substitute Alcohol to help with tapering If you have been on opioids for less than two weeks and do not have  pain than it is ok to stop all together.  Plan to wean off of opioids This plan should start within one week post op of your joint replacement. Maintain the same interval or time between taking each dose and first decrease the dose.  Cut the total daily intake of opioids by one tablet each day Next start to increase the time between doses. The last dose that should be eliminated is the evening dose.               Discharge Instructions      INSTRUCTIONS AFTER JOINT REPLACEMENT   Remove items at home which could result in a fall. This includes throw rugs or furniture in walking pathways ICE to the affected joint every three hours while awake for 30 minutes at a time, for at least the first 3-5 days, and then as needed for pain and swelling.  Continue to use ice for pain and swelling. You may notice swelling that will progress down to the foot and ankle.  This is normal after surgery.  Elevate your leg when you are not up walking on it.   Continue to use the breathing machine you got in the hospital (incentive spirometer) which will help keep your temperature down.  It is common for your temperature to cycle up and down following surgery, especially at night when you are not up moving around and exerting yourself.  The breathing machine keeps your lungs expanded and your temperature down.   DIET:  As you were doing prior  to hospitalization, we recommend a well-balanced diet.  DRESSING / WOUND CARE / SHOWERING  Keep the surgical dressing until follow up.  The dressing is water proof, so you can shower without any extra covering.  IF THE DRESSING FALLS OFF or the wound gets wet inside, change the dressing with sterile gauze.  Please use good hand washing techniques before changing the dressing.  Do not use any lotions or creams on the incision until instructed by your surgeon.    ACTIVITY  Increase activity slowly as tolerated, but follow the weight bearing instructions below.   No driving for 6 weeks or until further direction given by your physician.  You cannot drive while taking narcotics.  No lifting or carrying greater than 10 lbs. until further directed by your surgeon. Avoid periods of inactivity such as sitting longer than an hour when not asleep. This helps prevent blood clots.  You may return to work once you are authorized by your doctor.     WEIGHT BEARING   Weight bearing as tolerated with assist device (walker, cane, etc) as directed, use it as long as suggested by your surgeon or therapist, typically at least 4-6 weeks.   EXERCISES  Results after joint replacement surgery are often greatly improved when you follow the exercise, range of motion and muscle strengthening exercises prescribed by your doctor. Safety measures are also important to protect the joint from further injury. Any time any of these exercises cause you to have increased pain or swelling, decrease what you are doing until you are comfortable again and then slowly increase them. If you have problems or questions, call your caregiver or physical therapist for advice.   Rehabilitation is important following a joint replacement. After just a few days of immobilization, the muscles of the leg can become weakened and shrink (atrophy).  These exercises are designed to build up the tone and strength of the thigh and leg muscles and to  improve  motion. Often times heat used for twenty to thirty minutes before working out will loosen up your tissues and help with improving the range of motion but do not use heat for the first two weeks following surgery (sometimes heat can increase post-operative swelling).   These exercises can be done on a training (exercise) mat, on the floor, on a table or on a bed. Use whatever works the best and is most comfortable for you.    Use music or television while you are exercising so that the exercises are a pleasant break in your day. This will make your life better with the exercises acting as a break in your routine that you can look forward to.   Perform all exercises about fifteen times, three times per day or as directed.  You should exercise both the operative leg and the other leg as well.  Exercises include:   Quad Sets - Tighten up the muscle on the front of the thigh (Quad) and hold for 5-10 seconds.   Straight Leg Raises - With your knee straight (if you were given a brace, keep it on), lift the leg to 60 degrees, hold for 3 seconds, and slowly lower the leg.  Perform this exercise against resistance later as your leg gets stronger.  Leg Slides: Lying on your back, slowly slide your foot toward your buttocks, bending your knee up off the floor (only go as far as is comfortable). Then slowly slide your foot back down until your leg is flat on the floor again.  Angel Wings: Lying on your back spread your legs to the side as far apart as you can without causing discomfort.  Hamstring Strength:  Lying on your back, push your heel against the floor with your leg straight by tightening up the muscles of your buttocks.  Repeat, but this time bend your knee to a comfortable angle, and push your heel against the floor.  You may put a pillow under the heel to make it more comfortable if necessary.   A rehabilitation program following joint replacement surgery can speed recovery and prevent re-injury in  the future due to weakened muscles. Contact your doctor or a physical therapist for more information on knee rehabilitation.    CONSTIPATION  Constipation is defined medically as fewer than three stools per week and severe constipation as less than one stool per week.  Even if you have a regular bowel pattern at home, your normal regimen is likely to be disrupted due to multiple reasons following surgery.  Combination of anesthesia, postoperative narcotics, change in appetite and fluid intake all can affect your bowels.   YOU MUST use at least one of the following options; they are listed in order of increasing strength to get the job done.  They are all available over the counter, and you may need to use some, POSSIBLY even all of these options:    Drink plenty of fluids (prune juice may be helpful) and high fiber foods Colace 100 mg by mouth twice a day  Senokot for constipation as directed and as needed Dulcolax (bisacodyl), take with full glass of water  Miralax (polyethylene glycol) once or twice a day as needed.  If you have tried all these things and are unable to have a bowel movement in the first 3-4 days after surgery call either your surgeon or your primary doctor.    If you experience loose stools or diarrhea, hold the medications until you stool forms back up.  If your symptoms do not get better within 1 week or if they get worse, check with your doctor.  If you experience "the worst abdominal pain ever" or develop nausea or vomiting, please contact the office immediately for further recommendations for treatment.   ITCHING:  If you experience itching with your medications, try taking only a single pain pill, or even half a pain pill at a time.  You can also use Benadryl over the counter for itching or also to help with sleep.   TED HOSE STOCKINGS:  Use stockings on both legs until for at least 2 weeks or as directed by physician office. They may be removed at night for  sleeping.  MEDICATIONS:  See your medication summary on the "After Visit Summary" that nursing will review with you.  You may have some home medications which will be placed on hold until you complete the course of blood thinner medication.  It is important for you to complete the blood thinner medication as prescribed.   Blood clot prevention (DVT Prophylaxis): After surgery you are at an increased risk for a blood clot. you were prescribed a blood thinner, Aspirin '81mg'$ , to be taken twice daily for a total of 4 weeks from surgery to help reduce your risk of getting a blood clot. This will help prevent a blood clot. Signs of a pulmonary embolus (blood clot in the lungs) include sudden short of breath, feeling lightheaded or dizzy, chest pain with a deep breath, rapid pulse rapid breathing. Signs of a blood clot in your arms or legs include new unexplained swelling and cramping, warm, red or darkened skin around the painful area. Please call the office or 911 right away if these signs or symptoms develop.  PRECAUTIONS:  If you experience chest pain or shortness of breath - call 911 immediately for transfer to the hospital emergency department.   If you develop a fever greater that 101 F, purulent drainage from wound, increased redness or drainage from wound, foul odor from the wound/dressing, or calf pain - CONTACT YOUR SURGEON.                                                   FOLLOW-UP APPOINTMENTS:  If you do not already have a post-op appointment, please call the office for an appointment to be seen by your surgeon.  Guidelines for how soon to be seen are listed in your "After Visit Summary", but are typically between 2-3 weeks after surgery.  OTHER INSTRUCTIONS:   Knee Replacement:  Do not place pillow under knee, focus on keeping the knee straight while resting. CPM instructions: 0-90 degrees, 2 hours in the morning, 2 hours in the afternoon, and 2 hours in the evening. Place foam block, curve  side up under heel at all times except when in CPM or when walking.  DO NOT modify, tear, cut, or change the foam block in any way.  POST-OPERATIVE OPIOID TAPER INSTRUCTIONS: It is important to wean off of your opioid medication as soon as possible. If you do not need pain medication after your surgery it is ok to stop day one. Opioids include: Codeine, Hydrocodone(Norco, Vicodin), Oxycodone(Percocet, oxycontin) and hydromorphone amongst others.  Long term and even short term use of opiods can cause: Increased pain response Dependence Constipation Depression Respiratory depression And more.  Withdrawal symptoms can  include Flu like symptoms Nausea, vomiting And more Techniques to manage these symptoms Hydrate well Eat regular healthy meals Stay active Use relaxation techniques(deep breathing, meditating, yoga) Do Not substitute Alcohol to help with tapering If you have been on opioids for less than two weeks and do not have pain than it is ok to stop all together.  Plan to wean off of opioids This plan should start within one week post op of your joint replacement. Maintain the same interval or time between taking each dose and first decrease the dose.  Cut the total daily intake of opioids by one tablet each day Next start to increase the time between doses. The last dose that should be eliminated is the evening dose.   MAKE SURE YOU:  Understand these instructions.  Get help right away if you are not doing well or get worse.    Thank you for letting us be a part of your medical care team.  It is a privilege we respect greatly.  We hope these instructions will help you stay on track for a fast and full recovery!           Signed: Nicola Quesnell A Henretta Quist 01/07/2022, 7:14 AM

## 2022-01-07 NOTE — TOC Transition Note (Signed)
Transition of Care [facility]) - CM/SW Discharge Note   Patient Details  Name: Omarius Grantham MRN: [id number] Date of Birth: [birth year]  [facility]) CM/SW Contact:  Lennart Pall, LCSW Phone Number: 01/07/2022, 1:38 PM   Clinical Narrative:    Confirming pt has needed DME at home.  OPPT already arranged.  No TOC needs.   Final next level of care: OP Rehab Barriers to Discharge: No Barriers Identified   Patient Goals and CMS Choice Patient states their goals for this hospitalization and ongoing recovery are:: return home      Discharge Placement                       Discharge Plan and Services                DME Arranged: N/A DME Agency: NA                  Social Determinants of Health (SDOH) Interventions     Readmission Risk Interventions     No data to display

## 2022-01-07 NOTE — Progress Notes (Signed)
Physical Therapy Treatment Patient Details Name: Daniel Humphrey MRN: [id number] DOB: 1955-04-21 Today's Date: 01/07/2022   History of Present Illness  Patient is 66 y.o. male s/p Lt TKA on 01/06/22 with PMH significant for OA, HTN, CAD, OSA, Cervical fusion, Rt colectomy in 2019, Rt TKA.    PT Comments    Patient making excellent progress with mobility and demonstrated safe carryover for transfers and gait with RW. Min guard/supervision throughout with pt's wife providing safe guarding with supervision and verbal cues from therapist. Pt completed stair mobility with no LOB and educated on HEP for ROM and strengthening. He is mobilizing at safe level for discharge home with assist from family. Will progress during stay.   Recommendations for follow up therapy are one component of a multi-disciplinary discharge planning process, led by the attending physician.  Recommendations may be updated based on patient status, additional functional criteria and insurance authorization. PT Recommendation   Follow Up Recommendations Follow physician's recommendations for discharge plan and follow up therapies Filed 01/07/2022 0858  Assistance recommended at discharge Intermittent Supervision/Assistance Filed 01/07/2022 0858  Patient can return home with the following A little help with walking and/or transfers, A little help with bathing/dressing/bathroom, Assistance with cooking/housework, Direct supervision/assist for medications management, Assist for transportation, Help with stairs or ramp for entrance [facility] 01/07/2022 0858  Functional Status Assessment Patient has had a recent decline in their functional status and demonstrates the ability to make significant improvements in function in a reasonable and predictable amount of time. Filed 01/07/2022 0858  PT equipment Rolling walker (2 wheels) Filed 01/07/2022 0858   Mobility  Bed Mobility Overal bed mobility: Needs Assistance Bed Mobility: Supine  to Sit     Supine to sit: HOB elevated, Min guard     General bed mobility comments: pt using belt to assist Lt LE off EOB, min guard/supervision for safety    Transfers Overall transfer level: Needs assistance Equipment used: Rolling walker (2 wheels) Transfers: Sit to/from Stand Sit to Stand: Min guard, Supervision           General transfer comment: pt with good recall for hand placement to power up from EOB. guard/supervision for rise.    Ambulation/Gait Ambulation/Gait assistance: Min guard, Supervision Gait Distance (Feet): 250 Feet Assistive device: Rolling walker (2 wheels) Gait Pattern/deviations: Step-to pattern, Decreased stride length, Decreased stance time - left, Decreased weight shift to left, Shuffle Gait velocity: decr     General Gait Details: pt recalled safe step pattern and proximity to RW, pt's wife present and provided safe guarding with cues and supervision from therapist.   Stairs Stairs: Yes Stairs assistance: Min guard, Supervision Stair Management: Two rails, Step to pattern, Forwards Number of Stairs: 2 General stair comments: cues for step pattern "up with good, down with bad" and for use of rails as well as demonstration of RW for support. pt's spouse provided safe guarding with cues/supervision from therapist.   Wheelchair Mobility    Modified Rankin (Stroke Patients Only)       Balance Overall balance assessment: Needs assistance Sitting-balance support: Feet supported Sitting balance-Leahy Scale: Good     Standing balance support: Reliant on assistive device for balance, During functional activity, Bilateral upper extremity supported Standing balance-Leahy Scale: Poor                              Cognition Arousal/Alertness: Awake/alert Behavior During Therapy: WFL for tasks assessed/performed Overall Cognitive Status:  Within Functional Limits for tasks assessed                                           Exercises Total Joint Exercises Ankle Circles/Pumps: AROM, Both, 20 reps Quad Sets: AROM, Left, 5 reps Short Arc Quad: AROM, Left, 5 reps Heel Slides: AROM, Left, 5 reps Hip ABduction/ADduction: AROM, Left, 5 reps    General Comments        Pertinent Vitals/Pain Pain Assessment Pain Assessment: 0-10 Pain Score: 3  Pain Location: Lt knee Pain Descriptors / Indicators: Aching, Discomfort Pain Intervention(s): Limited activity within patient's tolerance, Monitored during session, Repositioned      01/07/22 0858  PT - End of Session  Equipment Utilized During Treatment Gait belt  Activity Tolerance Patient tolerated treatment well  Patient left in chair;with call bell/phone within reach;with chair alarm set;with family/visitor present  Nurse Communication Mobility status   PT - Assessment/Plan  PT Plan Current plan remains appropriate  PT Visit Diagnosis Unsteadiness on feet (R26.81);Muscle weakness (generalized) (M62.81);Difficulty in walking, not elsewhere classified (R26.2)  PT Frequency (ACUTE ONLY) 7X/week  Follow Up Recommendations Follow physician's recommendations for discharge plan and follow up therapies  Assistance recommended at discharge Intermittent Supervision/Assistance  Patient can return home with the following A little help with walking and/or transfers;A little help with bathing/dressing/bathroom;Assistance with cooking/housework;Direct supervision/assist for medications management;Assist for transportation;Help with stairs or ramp for entrance  PT equipment Rolling walker (2 wheels)  AM-PAC PT "6 Clicks" Mobility Outcome Measure (Version 2)  Help needed turning from your back to your side while in a flat bed without using bedrails? 3  Help needed moving from lying on your back to sitting on the side of a flat bed without using bedrails? 3  Help needed moving to and from a bed to a chair (including a wheelchair)? 3  Help needed standing up from a  chair using your arms (e.g., wheelchair or bedside chair)? 3  Help needed to walk in hospital room? 3  Help needed climbing 3-5 steps with a railing?  3  6 Click Score 18  Consider Recommendation of Discharge To: [facility] with [facility]  Progressive Mobility  What is the highest level of mobility based on the progressive mobility assessment? Level 5 (Walks with assist in room/hall) - Balance while stepping forward/back and can walk in room with assist - Complete  Mobility Referral No  Activity Ambulated with assistance in hallway  PT Goal Progression  Progress towards PT goals Progressing toward goals  Acute Rehab PT Goals  PT Goal Formulation With patient/family  Time For Goal Achievement 01/13/22  Potential to Achieve Goals Good  PT Time Calculation  PT Start Time (ACUTE ONLY) 0857  PT Stop Time (ACUTE ONLY) 0925  PT Time Calculation (min) (ACUTE ONLY) 28 min  PT General Charges  $$ ACUTE PT VISIT 1 Visit  PT Treatments  $Gait Training 8-22 mins  $Therapeutic Exercise 8-22 mins     Verner Mould, DPT Acute Rehabilitation Services Office [phone]  01/07/22 1:01 PM

## 2022-01-08 ENCOUNTER — Telehealth: Payer: Self-pay | Admitting: Interventional Cardiology

## 2022-01-08 NOTE — Telephone Encounter (Signed)
I spoke with patient and advised him to keep this appointment.  He is checking his BP at home and will bring readings and cuff to appointment with pharmacist.

## 2022-01-08 NOTE — Telephone Encounter (Signed)
Patient is requesting call back in regards to upcoming pharmacist appt. He would like to know if this appt is needed. Please advise.

## 2022-01-18 ENCOUNTER — Ambulatory Visit: Payer: No Typology Code available for payment source | Attending: Cardiology | Admitting: Pharmacist

## 2022-01-18 VITALS — BP 114/60 | HR 61

## 2022-01-18 DIAGNOSIS — I10 Essential (primary) hypertension: Secondary | ICD-10-CM

## 2022-01-18 NOTE — Patient Instructions (Signed)
Summary of today's discussion  Continue taking carvedilol '25mg'$  twice a day  2. Start adding in daily walking  3. Add in strength training  4. Rest 5 min before checking blood pressure  5. Please send me blood pressure readings in 3 weeks   Your blood pressure goal is <130/80  To check your pressure at home you will need to:  1. Sit up in a chair, with feet flat on the floor and back supported. Do not cross your ankles or legs. 2. Rest your left arm so that the cuff is about heart level. If the cuff goes on your upper arm,  then just relax the arm on the table, arm of the chair or your lap. If you have a wrist cuff, we  suggest relaxing your wrist against your chest (think of it as Pledging the Flag with the  wrong arm).  3. Place the cuff snugly around your arm, about 1 inch above the crook of your elbow. The  cords should be inside the groove of your elbow.  4. Sit quietly, with the cuff in place, for about 5 minutes. After that 5 minutes press the power  button to start a reading. 5. Do not talk or move while the reading is taking place.  6. Record your readings on a sheet of paper. Although most cuffs have a memory, it is often  easier to see a pattern developing when the numbers are all in front of you.  7. You can repeat the reading after 1-3 minutes if it is recommended  Make sure your bladder is empty and you have not had caffeine or tobacco within the last 30 min  Always bring your blood pressure log with you to your appointments. If you have not brought your monitor in to be double checked for accuracy, please bring it to your next appointment.  You can find a list of validated (accurate) blood pressure cuffs at [employer]   Important lifestyle changes to control high blood pressure  Intervention  Effect on the BP  Lose extra pounds and watch your waistline Weight loss is one of the most effective lifestyle changes for controlling blood pressure. If you're overweight  or obese, losing even a small amount of weight can help reduce blood pressure. Blood pressure might go down by about 1 millimeter of mercury (mm Hg) with each kilogram (about 2.2 pounds) of weight lost.  Exercise regularly As a general goal, aim for at least 30 minutes of moderate physical activity every day. Regular physical activity can lower high blood pressure by about 5 to 8 mm Hg.  Eat a healthy diet Eating a diet rich in whole grains, fruits, vegetables, and low-fat dairy products and low in saturated fat and cholesterol. A healthy diet can lower high blood pressure by up to 11 mm Hg.  Reduce salt (sodium) in your diet Even a small reduction of sodium in the diet can improve heart health and reduce high blood pressure by about 5 to 6 mm Hg.  Limit alcohol One drink equals 12 ounces of beer, 5 ounces of wine, or 1.5 ounces of 80-proof liquor.  Limiting alcohol to less than one drink a day for women or two drinks a day for men can help lower blood pressure by about 4 mm Hg.   Please call me at [phone] with any questions.

## 2022-01-18 NOTE — Assessment & Plan Note (Signed)
-  Assessment: Patient has been using a new cuff and monitoring blood pressure two times daily. Home readings have been elevated, but patient disclosed he usually only waits a minute before taking the first reading, and then with the second reading, is closer to his goal of <130/80. He is no longer taking sudafed products and is taking celebrex for pain management after surgery along with tylenol and oxycodone. Has been partaking in physical therapy and re-building strength of his leg after surgery. HR at home has ranged from mid 60s-70s, and home cuff has matched with manual office blood pressure readings. Patient has been complaint with coreg  -Plan:  Continue current blood pressure regimen of Coreg '25mg'$  PO BID Recommend waiting 5 minutes prior to first blood pressure reading and continuing using proper technique (relaxed arm, emptied bladder, sitting up straight, and feet flat on floor), and checking blood pressure twice daily Discussed increasing physical activity into his daily routine by walking more as leg continues to heal and incorporating strength training Follow up within 3 weeks and send home blood pressure readings via MyChart

## 2022-01-18 NOTE — Progress Notes (Signed)
Patient ID: Daniel Humphrey                 DOB: 06-Jun-1955                      MRN: [id number]      HPI: Daniel Humphrey is a 66 y.o. male referred by Dr. Irish Lack to HTN clinic. PMH is significant for CAD, HTN, HLD, OSA (has been complaint and using CPAP machine), and previous total knee arthroplasty on 01/06/2022. Past visit on 10/11 showed better controlled blood pressure of 130/74 from previous visit in September. Patient currently takes coreg '25mg'$  BID.  Patient is taking celebrex for 14 days after his surgery and has previous history of using sudafed for congestion, but now only uses zyrtec with no sudafed.  According to patient, checks bp after sitting for a minute and notices high bps, but the longer he waits the closer his readings are to goal of <130/80. Patient took recommendation to buy a new cuff and brought with him to his visit. Home cuff found to accurate today.  Patient has had no signs/symptoms of low or high blood pressure and is compliant with his coreg.  Pain has been well controlled. Has been taking his tylenol and oxycodone. Tries to only take tylenol for pain, but started taking celebrex twice daily since end of last week.  Current HTN meds: Coreg '25mg'$  BID Previously tried: N/A BP goal: <130/80  Family History: The patient's family history includes Heart attack in his brother, brother, father, and mother; Heart disease in his brother and brother; Hyperlipidemia in his father and mother; Hypertension in his sister.    Social History: The patient reports that he quit smoking about 34 years ago. His smoking use included cigarettes. He has a 3.00 pack-year smoking history. He has never used smokeless tobacco. He reports current alcohol use.  He reports that he does not use drugs.    Diet: drinks water and 2 cups of coffee, no other caffeine beverages  Exercise: Patient is post surgery and is getting up and moving better while being active in PT/rehab. Has an active job  prior to surgery, but did not do much physical activity outside of that.   Home BP readings:  Bp before surgery:  10/23 152/75 10/24 138/79 10/25 145/76 Bp after surgery:  11/3 127/66 11/4 140/86, 145/70 11/5 155/72, 129/66   Office BP readings:  Home bp machine:  115/62 HR 66 121/64 112/60 Manual bp:  120/58 114/60 HR 61 Wt Readings from Last 3 Encounters:  01/06/22 238 lb 1.6 oz (108 kg)  12/24/21 238 lb 1.6 oz (108 kg)  12/23/21 238 lb 3.2 oz (108 kg)   BP Readings from Last 3 Encounters:  01/07/22 130/68  12/24/21 (!) 153/80  12/23/21 130/74   Pulse Readings from Last 3 Encounters:  01/07/22 73  12/23/21 63  12/01/21 62    Renal function: Estimated Creatinine Clearance: 86.6 mL/min (by C-G formula based on SCr of 1.06 mg/dL).  Past Medical History:  Diagnosis Date   Aortic root dilation (HCC)    Arthritis    OA- R knee, hands    ED (erectile dysfunction)    Hepatitis 1975   A viral   High cholesterol    Hypertension    Mild CAD    a. by cardiac CT 08/2018.   Obesity    OSA (obstructive sleep apnea) 04/15/2015   Severe OSA with AHI 35.2 and successful CPAP titration  to [place], needs CPAP, will continue to explore    Seasonal allergies     Current Outpatient Medications on File Prior to Visit  Medication Sig Dispense Refill   acetaminophen (TYLENOL) 325 MG tablet Take 650 mg by mouth every 6 (six) hours as needed for moderate pain or headache.     amoxicillin (AMOXIL) 500 MG tablet Take 2,000 mg by mouth as directed. For dental appointments     aspirin 81 MG tablet Take 1 tablet (81 mg total) by mouth in the morning and at bedtime. 60 tablet 0   carvedilol (COREG) 25 MG tablet Take 1 tablet (25 mg total) by mouth 2 (two) times daily. 180 tablet 3   celecoxib (CELEBREX) 100 MG capsule Take 1 capsule (100 mg total) by mouth 2 (two) times daily for 14 days. 28 capsule 0   cetirizine (ZYRTEC) 10 MG tablet Take 10 mg by mouth daily.     fluticasone  (FLONASE) 50 MCG/ACT nasal spray Place 1 spray into both nostrils in the morning.     Multiple Vitamin (MULTIVITAMIN WITH MINERALS) TABS Take 1 tablet by mouth daily.     NON FORMULARY Pt uses a cpap nightly     ondansetron (ZOFRAN) 4 MG tablet Take 1 tablet (4 mg total) by mouth every 8 (eight) hours as needed for up to 14 days for nausea or vomiting. 14 tablet 0   rosuvastatin (CRESTOR) 10 MG tablet Take 1 tablet (10 mg total) by mouth daily. 90 tablet 3   No current facility-administered medications on file prior to visit.    No Known Allergies  There were no vitals taken for this visit.  1. Hypertension -   -Assessment: Patient has been using a new cuff and monitoring blood pressure two times daily. Home readings have been elevated, but patient disclosed he usually only waits a minute before taking the first reading, and then with the second reading, is closer to his goal of <130/80. He is no longer taking sudafed products and is taking celebrex for pain management after surgery along with tylenol and oxycodone. Has been partaking in physical therapy and re-building strength of his leg after surgery. HR at home has ranged from mid 60s-70s, and home cuff has matched with manual office blood pressure readings. Patient has been complaint with coreg  -Plan:  Continue current blood pressure regimen of Coreg '25mg'$  PO BID Recommend waiting 5 minutes prior to first blood pressure reading and continuing using proper technique (relaxed arm, emptied bladder, sitting up straight, and feet flat on floor), and checking blood pressure twice daily Discussed increasing physical activity into his daily routine by walking more as leg continues to heal and incorporating strength training Follow up within 3 weeks and send home blood pressure readings via MyChart   Thank you  Sandford Craze, PharmD. [facility] Acute Care PGY-1 01/18/2022 9:20 AM   Ramond Dial, Pharm.D, BCPS, CPP [facility] A  Division of [facility] [address], [place], [place] [address]  Phone: [phone]; Fax: [phone]

## 2022-02-08 ENCOUNTER — Encounter: Payer: Self-pay | Admitting: Pharmacist

## 2022-08-03 ENCOUNTER — Telehealth: Payer: Self-pay | Admitting: Cardiology

## 2022-08-03 DIAGNOSIS — G4733 Obstructive sleep apnea (adult) (pediatric): Secondary | ICD-10-CM

## 2022-08-03 DIAGNOSIS — I10 Essential (primary) hypertension: Secondary | ICD-10-CM

## 2022-08-03 NOTE — Telephone Encounter (Signed)
Pt stated he needs a prescription for cpap supplies due to the place he used before not carrying items anymore. Pt is requesting a callback. Please advise

## 2022-08-05 NOTE — Telephone Encounter (Signed)
New supplies order sent to [facility]   DME selection is [facility] Patient understands he will be contacted by [facility] to set up his cpap. Patient understands to call if [facility] does not contact him with new setup in a timely manner. Patient understands they will be called once confirmation has been received from [facility] that they have received their new machine to schedule 10 week follow up appointment.   [facility] notified of new cpap order  Please add to airview Patient was grateful for the call and thanked me.

## 2022-10-11 ENCOUNTER — Telehealth: Payer: Self-pay | Admitting: *Deleted

## 2022-10-11 NOTE — Telephone Encounter (Signed)
   Pre-operative Risk Assessment    Patient Name: Daniel Humphrey  DOB: 10/13/1955 MRN: [id number]      Request for Surgical Clearance    Procedure:   COLONOSCOPY; H/O COLON POLYPS/SURVEILLANCE   Date of Surgery:  Clearance TBD                                 Surgeon:  DR. MARC MAGOD Surgeon's Group or Practice Name:  [facility] Phone number:  [phone] Fax number:  [phone]   Type of Clearance Requested:   - Medical ; NO MEDICATIONS LISTED AS NEEDING TO BE HELD   Type of Anesthesia:   PROPOFOL   Additional requests/questions:    Elpidio Anis   10/11/2022, 3:23 PM

## 2022-10-11 NOTE — Telephone Encounter (Signed)
   Name: Daniel Humphrey [facility]  DOB: 07-13-1955  MRN: [id number]  Primary Cardiologist: Lance Muss, MD   Preoperative team, please contact this patient and set up a phone call appointment for further preoperative risk assessment. Please obtain consent and complete medication review. Thank you for your help.  I confirm that guidance regarding antiplatelet and oral anticoagulation therapy has been completed and, if necessary, noted below.  None   Napoleon Form, Leodis Rains, NP 10/11/2022, 3:36 PM [facility]

## 2022-10-12 ENCOUNTER — Telehealth: Payer: Self-pay | Admitting: *Deleted

## 2022-10-12 NOTE — Telephone Encounter (Signed)
Pt called back and has been scheduled a tele pre op appt 10/22/22 @ 2:20. Med rec and consent are done.

## 2022-10-12 NOTE — Telephone Encounter (Signed)
1st attempt to reach pt regarding surgical clearance and the need for a tele-visit.  Left pt a message to call back and ask for the preop team. 

## 2022-10-12 NOTE — Telephone Encounter (Signed)
Pt called back and has been scheduled a tele pre op appt 10/22/22 @ 2:20. Med rec and consent are done.      Patient Consent for Virtual Visit        [name] has provided verbal consent on 10/12/2022 for a virtual visit (video or telephone).   CONSENT FOR VIRTUAL VISIT FOR:  Daniel Humphrey  By participating in this virtual visit I agree to the following:  I hereby voluntarily request, consent and authorize [facility] and its employed or contracted physicians, physician assistants, nurse practitioners or other licensed health care professionals (the Practitioner), to provide me with telemedicine health care services (the "Services") as deemed necessary by the treating Practitioner. I acknowledge and consent to receive the Services by the Practitioner via telemedicine. I understand that the telemedicine visit will involve communicating with the Practitioner through live audiovisual communication technology and the disclosure of certain medical information by electronic transmission. I acknowledge that I have been given the opportunity to request an in-person assessment or other available alternative prior to the telemedicine visit and am voluntarily participating in the telemedicine visit.  I understand that I have the right to withhold or withdraw my consent to the use of telemedicine in the course of my care at any time, without affecting my right to future care or treatment, and that the Practitioner or I may terminate the telemedicine visit at any time. I understand that I have the right to inspect all information obtained and/or recorded in the course of the telemedicine visit and may receive copies of available information for a reasonable fee.  I understand that some of the potential risks of receiving the Services via telemedicine include:  Delay or interruption in medical evaluation due to technological equipment failure or disruption; Information transmitted may not  be sufficient (e.g. poor resolution of images) to allow for appropriate medical decision making by the Practitioner; and/or  In rare instances, security protocols could fail, causing a breach of personal health information.  Furthermore, I acknowledge that it is my responsibility to provide information about my medical history, conditions and care that is complete and accurate to the best of my ability. I acknowledge that Practitioner's advice, recommendations, and/or decision may be based on factors not within their control, such as incomplete or inaccurate data provided by me or distortions of diagnostic images or specimens that may result from electronic transmissions. I understand that the practice of medicine is not an exact science and that Practitioner makes no warranties or guarantees regarding treatment outcomes. I acknowledge that a copy of this consent can be made available to me via my patient portal [facility] MyChart), or I can request a printed copy by calling the office of [facility].    I understand that my insurance will be billed for this visit.   I have read or had this consent read to me. I understand the contents of this consent, which adequately explains the benefits and risks of the Services being provided via telemedicine.  I have been provided ample opportunity to ask questions regarding this consent and the Services and have had my questions answered to my satisfaction. I give my informed consent for the services to be provided through the use of telemedicine in my medical care

## 2022-10-22 ENCOUNTER — Other Ambulatory Visit: Payer: Self-pay | Admitting: *Deleted

## 2022-10-22 ENCOUNTER — Ambulatory Visit: Payer: Managed Care, Other (non HMO) | Attending: Cardiology

## 2022-10-22 DIAGNOSIS — Z01810 Encounter for preprocedural cardiovascular examination: Secondary | ICD-10-CM | POA: Diagnosis not present

## 2022-10-22 DIAGNOSIS — Z01818 Encounter for other preprocedural examination: Secondary | ICD-10-CM

## 2022-10-22 DIAGNOSIS — I712 Thoracic aortic aneurysm, without rupture, unspecified: Secondary | ICD-10-CM

## 2022-10-22 NOTE — Progress Notes (Signed)
Virtual Visit via Telephone Note   Because of Daniel Humphrey's co-morbid illnesses, he is at least at moderate risk for complications without adequate follow up.  This format is felt to be most appropriate for this patient at this time.  The patient did not have access to video technology/had technical difficulties with video requiring transitioning to audio format only (telephone).  All issues noted in this document were discussed and addressed.  No physical exam could be performed with this format.  Please refer to the patient's chart for his consent to telehealth for [facility].  Evaluation Performed:  Preoperative cardiovascular risk assessment _____________   Date:  10/22/2022   Patient ID:  Daniel Humphrey, DOB Aug 30, 1955, MRN [id number] Patient Location:  Home Provider location:   Office  Primary Care Provider:  Noberto Retort, MD Primary Cardiologist:  Lance Muss, MD  Chief Complaint / Patient Profile   67 y.o. y/o male with a h/o OSA on CPAP, coronary artery disease with nonobstructive CAD per coronary CTA in 2020, moderate aneurysmal dilatation of ascending thoracic aorta with a maximal diameter of 4.2 cm, DVT in 2022 treated with aspirin, hyperlipidemia.   He is pending colonoscopy with a history of colon polyps for surveillance on August 23,2024, by Dr. [name] and presents today for telephonic preoperative cardiovascular risk assessment.   History of Present Illness    Daniel Humphrey is a 67 y.o. male who presents via audio/video conferencing for a telehealth visit today.  Pt was last seen in cardiology clinic on 12/23/2021 by Dr. Carolanne Grumbling   At that time [name] was doing well .  The patient is now pending procedure as outlined above. Since his last visit, he   Past Medical History    Past Medical History:  Diagnosis Date   Aortic root dilation (HCC)    Arthritis    OA- R knee, hands    ED (erectile dysfunction)     Hepatitis 1975   A viral   High cholesterol    Hypertension    Mild CAD    a. by cardiac CT 08/2018.   Obesity    OSA (obstructive sleep apnea) 04/15/2015   Severe OSA with AHI 35.2 and successful CPAP titration to 9cmH2O, needs CPAP, will continue to explore    Seasonal allergies    Past Surgical History:  Procedure Laterality Date   CERVICAL FUSION  03/15/1997   COLON RESECTION Right 07/29/2017   Procedure: POSSIBLE RIGHT COLON RESECTION;  Surgeon: [name], MD;  Location: [facility];  Service: General;  Laterality: Right;   COLONOSCOPY WITH PROPOFOL  04/19/2012   Procedure: COLONOSCOPY WITH PROPOFOL;  Surgeon: [name], MD;  Location: [facility];  Service: Endoscopy;  Laterality: N/A;   COLONOSCOPY WITH PROPOFOL N/A 01/17/2015   Procedure: COLONOSCOPY WITH PROPOFOL;  Surgeon: [name], MD;  Location: [facility];  Service: Endoscopy;  Laterality: N/A;   HOT HEMOSTASIS  04/19/2012   Procedure: HOT HEMOSTASIS (ARGON PLASMA COAGULATION/BICAP);  Surgeon: [name], MD;  Location: [facility];  Service: Endoscopy;  Laterality: N/A;   HOT HEMOSTASIS N/A 01/17/2015   Procedure: HOT HEMOSTASIS (ARGON PLASMA COAGULATION/BICAP);  Surgeon: [name], MD;  Location: [facility];  Service: Endoscopy;  Laterality: N/A;   IR RADIOLOGIST EVAL & MGMT  05/25/2017   IR RADIOLOGIST EVAL & MGMT  06/08/2017   LAPAROSCOPIC APPENDECTOMY N/A 07/29/2017   Procedure: APPENDECTOMY LAPAROSCOPIC ERAS PATHWAY;  Surgeon: [name], MD;  Location:  [facility];  Service: General;  Laterality: N/A;   NASAL SEPTUM SURGERY  03/15/1996   right knee replacement      TOTAL KNEE ARTHROPLASTY Left 01/06/2022   Procedure: TOTAL KNEE ARTHROPLASTY;  Surgeon: [name], MD;  Location: [facility];  Service: Orthopedics;  Laterality: Left;    Allergies  No Known Allergies  Home Medications    Prior to Admission medications   Medication Sig Start Date End Date Taking? Authorizing Provider   acetaminophen (TYLENOL) 325 MG tablet Take 650 mg by mouth every 6 (six) hours as needed for moderate pain or headache.    [provider]  amoxicillin (AMOXIL) 500 MG tablet Take 2,000 mg by mouth as directed. For dental appointments Patient not taking: Reported on 10/12/2022 12/18/21   [provider]  Aspirin 81 MG CAPS Take 81 mg by mouth daily.    [provider]  carvedilol (COREG) 25 MG tablet Take 1 tablet (25 mg total) by mouth 2 (two) times daily. 10/29/21   [name], MD  cetirizine (ZYRTEC) 10 MG tablet Take 10 mg by mouth daily.    [provider]  fluticasone (FLONASE) 50 MCG/ACT nasal spray Place 1 spray into both nostrils in the morning.    [provider]  Multiple Vitamin (MULTIVITAMIN WITH MINERALS) TABS Take 1 tablet by mouth daily.    [provider]  NON FORMULARY Pt uses a cpap nightly    [provider]  rosuvastatin (CRESTOR) 10 MG tablet Take 1 tablet (10 mg total) by mouth daily. 10/29/21   [name], MD    Physical Exam    Vital Signs:  [name] does not have vital signs available for review today.   Given telephonic nature of communication, physical exam is limited. AAOx3. NAD. Normal affect.  Speech and respirations are unlabored.  Accessory Clinical Findings    None  Assessment & Plan    1.  Preoperative Cardiovascular Risk Assessment:  According to the Revised Cardiac Risk Index (RCRI), his Perioperative Risk of Major Cardiac Event is (%): 0.4  His Functional Capacity in METs is: 9.89 according to the Duke Activity Status Index (DASI)  Per office protocol, if patient is without any new symptoms or concerns at the time of their virtual visit, he/she may hold aspirin for 7 days prior to procedure. Please resume aspirin as soon as possible postprocedure, at the discretion of the surgeon.    Therefore, based on ACC/AHA guidelines, patient would be at acceptable  risk for the planned procedure without further cardiovascular testing. I will route this recommendation to the requesting party via Epic fax function.   The patient was advised that if he develops new symptoms prior to surgery to contact our office to arrange for a follow-up visit, and he verbalized understanding.    Time:   Today, I have spent 10 minutes with the patient with telehealth technology discussing medical history, symptoms, and management plan.     Joni Reining, NP  10/22/2022, 2:25 PM

## 2022-10-24 ENCOUNTER — Other Ambulatory Visit: Payer: Self-pay | Admitting: Interventional Cardiology

## 2022-11-23 ENCOUNTER — Ambulatory Visit
Admission: RE | Admit: 2022-11-23 | Discharge: 2022-11-23 | Disposition: A | Discharge status: Home / Self Care | Payer: Managed Care, Other (non HMO) | Source: Ambulatory Visit | Attending: Interventional Cardiology | Admitting: Interventional Cardiology

## 2022-11-23 DIAGNOSIS — I712 Thoracic aortic aneurysm, without rupture, unspecified: Secondary | ICD-10-CM

## 2022-11-23 MED ORDER — GADOPICLENOL 0.5 MMOL/ML IV SOLN
10.0000 mL | Freq: Once | INTRAVENOUS | Status: AC | PRN
  Administered 2022-11-23: 10 mL via INTRAVENOUS

## 2022-11-29 ENCOUNTER — Other Ambulatory Visit: Payer: Self-pay | Admitting: *Deleted

## 2022-11-29 DIAGNOSIS — I712 Thoracic aortic aneurysm, without rupture, unspecified: Secondary | ICD-10-CM

## 2022-12-22 ENCOUNTER — Ambulatory Visit: Payer: No Typology Code available for payment source | Admitting: Interventional Cardiology

## 2023-01-09 NOTE — Progress Notes (Unsigned)
Cardiology Office Note:   Date:  01/10/2023  ID:  Daniel Humphrey, DOB 10/29/1955, MRN [id number] PCP: Noberto Retort, MD  [facility] Providers Cardiologist:  Lance Muss, MD Sleep Medicine:  Armanda Magic, MD    History of Present Illness:   Discussed the use of AI scribe software for clinical note transcription with the patient, who gave verbal consent to proceed.  History of Present Illness   The patient, a 67 year old individual with a history of mild coronary artery disease, ascending aortic aneurysm, and obstructive sleep apnea, presents for routine follow-up. The patient's aneurysmal dilation of the aortic root and ascending thoracic aorta has been stable, as per the most recent MRA in September 2024.  The patient reports no significant changes in his exertional tolerance. 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 The patient's weight has increased since his last knee surgery a year ago, 236->252 and he expresses concern about the impact of this weight gain on his health. He acknowledges the need for dietary changes and increased physical activity to manage his weight and improve his overall health.   The patient's blood pressure readings at home have been variable, with systolic readings ranging from the 130s to 150s. He has been on carvedilol 25mg  twice daily for blood pressure control, and rosuvastatin 10mg  daily for cholesterol management. The patient has not had any side effects from these medications.      Today patient denies chest pain, shortness of breath, lower extremity edema, fatigue, palpitations, melena, hematuria, hemoptysis, diaphoresis, weakness, presyncope, syncope, orthopnea, and PND.   Studies Reviewed:    EKG:   EKG Interpretation Date/Time:  Monday January 10 2023 10:11:38 EDT Ventricular Rate:  63 PR Interval:  170 QRS Duration:  94 QT  Interval:  416 QTC Calculation: 425 R Axis:   36  Text Interpretation: Normal sinus rhythm Normal ECG When compared with ECG of 21-Dec-2012 15:10, No significant change was found Confirmed by Perlie Gold [phone]) on 01/10/2023 10:13:31 AM      Risk Assessment/Calculations:     HYPERTENSION CONTROL Vitals:   01/10/23 1007 01/10/23 1038  BP: 134/70 (!) 140/80    The patient's blood pressure is elevated above target today.  In order to address the patient's elevated BP: A new medication was prescribed today.           Physical Exam:   VS:  BP (!) 140/80   Pulse 63   Ht 5\' 10"  (1.778 m)   Wt 252 lb (114.3 kg)   SpO2 96%   BMI 36.16 kg/m    Wt Readings from Last 3 Encounters:  01/10/23 252 lb (114.3 kg)  01/06/22 238 lb 1.6 oz (108 kg)  12/24/21 238 lb 1.6 oz (108 kg)     Physical Exam Vitals reviewed.  Constitutional:      Appearance: Normal appearance.  HENT:     Head: Normocephalic.  Eyes:     Pupils: Pupils are equal, round, and reactive to light.  Cardiovascular:     Rate and Rhythm: Normal rate and regular rhythm.     Pulses: Normal pulses.     Heart sounds: Normal heart sounds.  Pulmonary:     Effort: Pulmonary effort is normal.     Breath sounds: Normal breath sounds.  Abdominal:     General: Abdomen is flat.     Palpations: Abdomen is soft.  Musculoskeletal:  Right lower leg: No edema.     Left lower leg: No edema.  Skin:    General: Skin is warm and dry.     Capillary Refill: Capillary refill takes less than 2 seconds.  Neurological:     General: No focal deficit present.     Mental Status: He is alert and oriented to person, place, and time.  Psychiatric:        Mood and Affect: Mood normal.        Behavior: Behavior normal.        Thought Content: Thought content normal.        Judgment: Judgment normal.     Physical Exam   VITALS: BP- 140/80, BP- 134/70 MEASUREMENTS: WT- 252       ASSESSMENT AND PLAN:     Assessment and  Plan    Aortic Aneurysm Stable fusiform aneurysmal dilation of the aortic root and ascending thoracic aorta on September 2024 MRA chest. No change from prior imaging. Discussed the importance of annual surveillance and blood pressure control. -Continue annual surveillance with MRA chest. -Continue blood pressure control measures.  Hypertension Blood pressure mildly to moderately elevated despite regular use of carvedilol 25mg  twice daily. Discussed the potential benefits of adding losartan for blood pressure control and kidney protection. -Start losartan 25mg  daily. -Check metabolic panel in 1 week to assess potassium levels. -Check blood pressure daily and report readings in a couple of weeks.  Hyperlipidemia Mild CAD LDL was 72 in May 2024, but recent weight gain may have led to an increase. Patient is currently on rosuvastatin 10mg  daily. Given mild CAD previously noted on CTA, ideal LDL <70. -Primary care to recheck lipid panel in November 2024. -Encourage weight loss and exercise.  Obstructive Sleep Apnea Patient reports regular use of CPAP and improved sleep and energy levels since last visit. -Continue CPAP use nightly.  Aortic Regurgitation Patient with mild MR by color flow doppler on 2020 TTE. Patient with benign physical exam and no symptoms to suggest significant change. -Continue to monitor symptoms  Weight Gain Patient reports significant weight gain since last knee surgery in 2023. Discussed the importance of diet and exercise, especially as patient approaches retirement. -Encourage establishment of exercise routine and healthy diet.  Follow-up in 2 months to assess response to losartan.             Signed, Perlie Gold, PA-C

## 2023-01-10 ENCOUNTER — Other Ambulatory Visit: Payer: Self-pay | Admitting: *Deleted

## 2023-01-10 ENCOUNTER — Ambulatory Visit: Payer: Managed Care, Other (non HMO) | Attending: Interventional Cardiology | Admitting: Cardiology

## 2023-01-10 ENCOUNTER — Encounter: Payer: Self-pay | Admitting: Cardiology

## 2023-01-10 VITALS — BP 140/80 | HR 63 | Ht 70.0 in | Wt 252.0 lb

## 2023-01-10 DIAGNOSIS — I10 Essential (primary) hypertension: Secondary | ICD-10-CM

## 2023-01-10 DIAGNOSIS — I712 Thoracic aortic aneurysm, without rupture, unspecified: Secondary | ICD-10-CM

## 2023-01-10 DIAGNOSIS — I351 Nonrheumatic aortic (valve) insufficiency: Secondary | ICD-10-CM | POA: Diagnosis not present

## 2023-01-10 DIAGNOSIS — G4733 Obstructive sleep apnea (adult) (pediatric): Secondary | ICD-10-CM

## 2023-01-10 DIAGNOSIS — E782 Mixed hyperlipidemia: Secondary | ICD-10-CM

## 2023-01-10 DIAGNOSIS — I2510 Atherosclerotic heart disease of native coronary artery without angina pectoris: Secondary | ICD-10-CM

## 2023-01-10 MED ORDER — LOSARTAN POTASSIUM 25 MG PO TABS: 25.0000 mg | ORAL_TABLET | Freq: Every day | ORAL | 3 refills | Status: DC

## 2023-01-10 NOTE — Patient Instructions (Addendum)
Medication Instructions:  Your physician has recommended you make the following change in your medication:   START Losartan 25 mg taking 1 daiy  *If you need a refill on your cardiac medications before your next appointment, please call your pharmacy*   Lab Work: 1 WEEK, YOU CAN GO TO ANY [facility], AFTER 7:30 AM FOR:  BMET  If you have labs (blood work) drawn today and your tests are completely normal, you will receive your results only by: MyChart Message (if you have MyChart) OR A paper copy in the mail If you have any lab test that is abnormal or we need to change your treatment, we will call you to review the results.   Testing/Procedures: None ordered   Follow-Up: At [facility], you and your health needs are our priority.  As part of our continuing mission to provide you with exceptional heart care, we have created designated Provider Care Teams.  These Care Teams include your primary Cardiologist (physician) and Advanced Practice Providers (APPs -  Physician Assistants and Nurse Practitioners) who all work together to provide you with the care you need, when you need it.  We recommend signing up for the patient portal called "MyChart".  Sign up information is provided on this After Visit Summary.  MyChart is used to connect with patients for Virtual Visits (Telemedicine).  Patients are able to view lab/test results, encounter notes, upcoming appointments, etc.  Non-urgent messages can be sent to your provider as well.   To learn more about what you can do with MyChart, go to [email or web address].    Your next appointment:   2 month(s)  Provider:   Perlie Gold, PA-C         Other Instructions

## 2023-01-20 ENCOUNTER — Other Ambulatory Visit: Payer: Self-pay | Admitting: Interventional Cardiology

## 2023-03-21 ENCOUNTER — Ambulatory Visit: Payer: Managed Care, Other (non HMO) | Admitting: Cardiology

## 2023-04-12 ENCOUNTER — Ambulatory Visit: Payer: Medicare Other | Attending: Cardiology | Admitting: Cardiology

## 2023-09-01 DIAGNOSIS — I351 Nonrheumatic aortic (valve) insufficiency: Secondary | ICD-10-CM | POA: Insufficient documentation

## 2023-09-01 NOTE — Progress Notes (Unsigned)
 Cardiology Office Note:   Date:  09/02/2023  ID:  Daniel Humphrey, DOB 1955/12/04, MRN [id number] PCP: Roselind Congo, MD  [facility] Providers Cardiologist:  Avery Bodo, MD Sleep Medicine:  Gaylyn Keas, MD {  History of Present Illness:   Daniel Humphrey is a 68 y.o. male with a history of mild coronary artery disease, ascending aortic aneurysm, and obstructive sleep apnea, presents for routine follow-up. The patient's aneurysmal dilation of the aortic root and ascending thoracic aorta has been stable, as per the most recent MRA in September 2024.  He was seen by Dr. Jacquelynn Matter in the past.    Since he was last seen he is retired and had knee surgery recently has not been as active.  He has had more shortness of breath as he started to try to walk.  He denies any resting shortness of breath and has had no PND or orthopnea.  He had no palpitations, presyncope or syncope.  He had no edema.  He is not having any chest pressure, neck or arm discomfort.  ROS: As stated in the HPI and negative for all other systems.  Studies Reviewed:    EKG:   EKG Interpretation Date/Time:  Friday September 02 2023 16:12:10 EDT Ventricular Rate:  68 PR Interval:  174 QRS Duration:  92 QT Interval:  406 QTC Calculation: 431 R Axis:   48  Text Interpretation: Normal sinus rhythm Normal ECG When compared with ECG of 10-Jan-2023 10:11, No significant change was found Confirmed by Eilleen Grates ([id number]) on 09/02/2023 5:28:24 PM    Risk Assessment/Calculations:         Physical Exam:   VS:  BP (!) 159/72   Pulse 74   Ht 5' 10 (1.778 m)   Wt 250 lb 9.6 oz (113.7 kg)   SpO2 94%   BMI 35.96 kg/m    Wt Readings from Last 3 Encounters:  09/02/23 250 lb 9.6 oz (113.7 kg)  01/10/23 252 lb (114.3 kg)  01/06/22 238 lb 1.6 oz (108 kg)     GEN: Well nourished, well developed in no acute distress NECK: No JVD; No carotid bruits CARDIAC: RRR, no murmurs, rubs, gallops RESPIRATORY:   Clear to auscultation without rales, wheezing or rhonchi  ABDOMEN: Soft, non-tender, non-distended EXTREMITIES:  No edema; No deformity   ASSESSMENT AND PLAN:   Aortic Aneurysm:  His aorta was 43 mm on MRI in Sept 2024.  I will follow this up pending the discussion below.  I might be imaging him further with a CT to look at his coronary arteries depending on the workup below.  I will discuss this with him and see him back in a couple months.  Hypertension: Blood pressure is not at target.  He stopped taking his Cozaar  because he did not think it was working.  He also said he got a little bit foggy on that.  He agrees to restart this and in fact I am going to increase to 50 mg daily.  He will keep a blood pressure diary.  Hyperlipidemia: He is going to get lipids by his primary provider.  He is not fasting today so I will wait for those to be drawn.  Goal LDL with his family history in my mind is in the 53s.  I am going to check an LP(a).   Obstructive Sleep Apnea: He uses CPAP.   Aortic Regurgitation:    This was trivial on echo in 2020.  No change in therapy  or further imaging  SOB: I suspect this may be weight and deconditioning.  I am going to screen him with a POET (Plain Old Exercise Treadmill).   I am going to get a BNP.  If these are negative I will see him back in a couple of months and if his shortness of breath is getting worse I will consider further testing such as coronary CTA.       Follow up in 2 months with me  Signed, Eilleen Grates, MD

## 2023-09-02 ENCOUNTER — Encounter: Payer: Self-pay | Admitting: Cardiology

## 2023-09-02 ENCOUNTER — Ambulatory Visit: Payer: Self-pay | Attending: Cardiology | Admitting: Cardiology

## 2023-09-02 VITALS — BP 159/72 | HR 74 | Ht 70.0 in | Wt 250.6 lb

## 2023-09-02 DIAGNOSIS — I10 Essential (primary) hypertension: Secondary | ICD-10-CM | POA: Diagnosis not present

## 2023-09-02 DIAGNOSIS — I77810 Thoracic aortic ectasia: Secondary | ICD-10-CM

## 2023-09-02 DIAGNOSIS — E785 Hyperlipidemia, unspecified: Secondary | ICD-10-CM

## 2023-09-02 DIAGNOSIS — R0602 Shortness of breath: Secondary | ICD-10-CM | POA: Diagnosis not present

## 2023-09-02 DIAGNOSIS — I351 Nonrheumatic aortic (valve) insufficiency: Secondary | ICD-10-CM | POA: Diagnosis not present

## 2023-09-02 MED ORDER — LOSARTAN POTASSIUM 50 MG PO TABS: 50.0000 mg | ORAL_TABLET | Freq: Every day | ORAL | 3 refills | Status: DC

## 2023-09-02 NOTE — Patient Instructions (Addendum)
 Medication Instructions:  Increase Losartan  to 50 mg once daily *If you need a refill on your cardiac medications before your next appointment, please call your pharmacy*  Lab Work: BNP, Lpa today If you have labs (blood work) drawn today and your tests are completely normal, you will receive your results only by: MyChart Message (if you have MyChart) OR A paper copy in the mail If you have any lab test that is abnormal or we need to change your treatment, we will call you to review the results.  Testing/Procedures: Exercise Stress Test ** No caffeine including chocolate or decaf for 12 hours prior **No food or drink for 3 hours prior **Wear comfortable clothing and close toed shoes  Follow-Up: At [facility], you and your health needs are our priority.  As part of our continuing mission to provide you with exceptional heart care, our providers are all part of one team.  This team includes your primary Cardiologist (physician) and Advanced Practice Providers or APPs (Physician Assistants and Nurse Practitioners) who all work together to provide you with the care you need, when you need it.  Your next appointment:   2 months  Provider:   Lavonne Prairie, MD  We recommend signing up for the patient portal called MyChart.  Sign up information is provided on this After Visit Summary.  MyChart is used to connect with patients for Virtual Visits (Telemedicine).  Patients are able to view lab/test results, encounter notes, upcoming appointments, etc.  Non-urgent messages can be sent to your provider as well.   To learn more about what you can do with MyChart, go to [email or web address].   Other Instructions Blood pressure diary: Take your blood pressure twice daily for 10 days and send us  the readings on MyChart

## 2023-09-04 ENCOUNTER — Ambulatory Visit: Payer: Self-pay | Admitting: Cardiology

## 2023-09-04 DIAGNOSIS — I10 Essential (primary) hypertension: Secondary | ICD-10-CM

## 2023-09-04 DIAGNOSIS — I351 Nonrheumatic aortic (valve) insufficiency: Secondary | ICD-10-CM

## 2023-09-04 DIAGNOSIS — I2510 Atherosclerotic heart disease of native coronary artery without angina pectoris: Secondary | ICD-10-CM

## 2023-09-04 DIAGNOSIS — Z01812 Encounter for preprocedural laboratory examination: Secondary | ICD-10-CM

## 2023-09-04 DIAGNOSIS — Z8249 Family history of ischemic heart disease and other diseases of the circulatory system: Secondary | ICD-10-CM

## 2023-09-05 LAB — LIPOPROTEIN A (LPA)

## 2023-09-05 LAB — PRO B NATRIURETIC PEPTIDE: NT-Pro BNP: 79 pg/mL (ref 0–376)

## 2023-09-14 ENCOUNTER — Encounter (HOSPITAL_COMMUNITY): Payer: Self-pay | Admitting: *Deleted

## 2023-09-14 ENCOUNTER — Telehealth (HOSPITAL_COMMUNITY): Payer: Self-pay | Admitting: *Deleted

## 2023-09-14 NOTE — Telephone Encounter (Signed)
 Reminder letter with instructions for upcoming GXT on 09/23/23 at 12:45 sent via [employer].

## 2023-09-23 ENCOUNTER — Encounter (HOSPITAL_COMMUNITY): Payer: Self-pay | Admitting: *Deleted

## 2023-09-23 ENCOUNTER — Ambulatory Visit (HOSPITAL_COMMUNITY)
Admission: RE | Admit: 2023-09-23 | Discharge: 2023-09-23 | Disposition: A | Discharge status: Home / Self Care | Source: Ambulatory Visit | Attending: Cardiology | Admitting: Cardiology

## 2023-09-23 DIAGNOSIS — R0602 Shortness of breath: Secondary | ICD-10-CM | POA: Insufficient documentation

## 2023-09-25 LAB — EXERCISE TOLERANCE TEST
Angina Index: 0
Estimated workload: 7
Exercise duration (min): 6 min
Exercise duration (sec): 0 s
MPHR: 154 {beats}/min
Peak HR: 141 {beats}/min
Percent HR: 91 %
RPE: 18
Rest HR: 65 {beats}/min

## 2023-09-28 DIAGNOSIS — R7303 Prediabetes: Secondary | ICD-10-CM | POA: Diagnosis not present

## 2023-09-28 DIAGNOSIS — J309 Allergic rhinitis, unspecified: Secondary | ICD-10-CM | POA: Diagnosis not present

## 2023-09-28 DIAGNOSIS — J0100 Acute maxillary sinusitis, unspecified: Secondary | ICD-10-CM | POA: Diagnosis not present

## 2023-09-28 DIAGNOSIS — E7800 Pure hypercholesterolemia, unspecified: Secondary | ICD-10-CM | POA: Diagnosis not present

## 2023-09-28 DIAGNOSIS — I10 Essential (primary) hypertension: Secondary | ICD-10-CM | POA: Diagnosis not present

## 2023-09-28 DIAGNOSIS — G4733 Obstructive sleep apnea (adult) (pediatric): Secondary | ICD-10-CM | POA: Diagnosis not present

## 2023-09-28 DIAGNOSIS — I77810 Thoracic aortic ectasia: Secondary | ICD-10-CM | POA: Diagnosis not present

## 2023-10-03 MED ORDER — METOPROLOL TARTRATE 100 MG PO TABS: 100.0000 mg | ORAL_TABLET | Freq: Once | ORAL | 0 refills | Status: DC

## 2023-10-03 NOTE — Telephone Encounter (Signed)
-----   Message from Lynwood Schilling sent at 09/29/2023 12:39 PM EDT ----- His stress test was equivocal.  It was difficult to absolutely say this was normal so I think it is prudent to do further testing and I would suggest a repeat coronary CTA.  I called him with  results.  Please call to arrange the study.   ----- Message ----- From: Okey Vina GAILS, MD Sent: 09/25/2023  10:31 PM EDT To: Lynwood Schilling, MD

## 2023-10-03 NOTE — Telephone Encounter (Signed)
 Spoke with pt regarding his results. Pt aware of repeat coronary CTA. Instructions sent via MyChart per pt's request. Pt verbalized understanding. All questions if any were answered.

## 2023-10-17 ENCOUNTER — Ambulatory Visit (HOSPITAL_COMMUNITY)
Admission: RE | Admit: 2023-10-17 | Discharge: 2023-10-17 | Disposition: A | Discharge status: Home / Self Care | Source: Ambulatory Visit | Attending: Cardiology | Admitting: Cardiology

## 2023-10-17 ENCOUNTER — Ambulatory Visit: Payer: Self-pay | Admitting: Cardiology

## 2023-10-17 DIAGNOSIS — I2510 Atherosclerotic heart disease of native coronary artery without angina pectoris: Secondary | ICD-10-CM | POA: Diagnosis not present

## 2023-10-17 DIAGNOSIS — I712 Thoracic aortic aneurysm, without rupture, unspecified: Secondary | ICD-10-CM

## 2023-10-17 DIAGNOSIS — Z8249 Family history of ischemic heart disease and other diseases of the circulatory system: Secondary | ICD-10-CM | POA: Insufficient documentation

## 2023-10-17 DIAGNOSIS — I7121 Aneurysm of the ascending aorta, without rupture: Secondary | ICD-10-CM | POA: Diagnosis not present

## 2023-10-17 DIAGNOSIS — I517 Cardiomegaly: Secondary | ICD-10-CM | POA: Insufficient documentation

## 2023-10-17 DIAGNOSIS — I351 Nonrheumatic aortic (valve) insufficiency: Secondary | ICD-10-CM | POA: Diagnosis not present

## 2023-10-17 MED ORDER — IOHEXOL 350 MG/ML SOLN
100.0000 mL | Freq: Once | INTRAVENOUS | Status: AC | PRN
  Administered 2023-10-17: 100 mL via INTRAVENOUS

## 2023-10-17 MED ORDER — NITROGLYCERIN 0.4 MG SL SUBL
0.8000 mg | SUBLINGUAL_TABLET | Freq: Once | SUBLINGUAL | Status: AC
  Administered 2023-10-17: 0.8 mg via SUBLINGUAL

## 2023-10-18 NOTE — Telephone Encounter (Signed)
 Spoke with pt regarding results. Pt aware of repeat CT scan in one year. Order placed. Pt verbalized understanding. All questions if any were answered.

## 2023-10-18 NOTE — Telephone Encounter (Signed)
-----   Message from Lynwood Schilling sent at 10/17/2023  3:36 PM EDT ----- The aorta was mildly enlarged at 42 mm.  I would like to repeat a CT of the thoracic aorta in one year to follow this.  He non obstructive coronary artery disease.  No further coronary work up is  needed.  He needs continued risk reduction.  Call Daniel Humphrey with the results and send results to Daniel Elsie SAUNDERS, MD ----- Message ----- From: Interface, Rad Results In Sent: 10/17/2023   3:01 PM EDT To: Lynwood Schilling, MD

## 2023-11-01 DIAGNOSIS — R0602 Shortness of breath: Secondary | ICD-10-CM | POA: Insufficient documentation

## 2023-11-01 DIAGNOSIS — I77819 Aortic ectasia, unspecified site: Secondary | ICD-10-CM | POA: Insufficient documentation

## 2023-11-01 NOTE — Progress Notes (Unsigned)
  Cardiology Office Note:   Date:  11/03/2023  ID:  Daniel Humphrey, DOB 01-09-1956, MRN [id number] PCP: Arloa Elsie SAUNDERS, MD  [facility] Providers Cardiologist:  Candyce Reek, MD Sleep Medicine:  Wilbert Bihari, MD {  History of Present Illness:   Daniel Humphrey is a 68 y.o. male with a history of mild coronary artery disease, ascending aortic aneurysm, and obstructive sleep apnea, presents for routine follow-up. The patient's aneurysmal dilation of the aortic root and ascending thoracic aorta has been stable, as per the most recent MRA in September 2024.  He was seen by Dr. Reek in the past.  At our first visit he had SOB and I sent him for a POET (Plain Old Exercise Treadmill) which was equivocal.  He had a coronary CT with non obstructive disease.  The aorta was 42 mm in August 2025.    Since I last saw him he has done okay. The patient denies any new symptoms such as chest discomfort, neck or arm discomfort. There has been no new shortness of breath, PND or orthopnea. There have been no reported palpitations, presyncope or syncope.     He walks about 1/2-hour every day and has no limitations.  ROS: As stated in the HPI and negative for all other systems.  Studies Reviewed:    EKG:     NA  Risk Assessment/Calculations:       Physical Exam:   VS:  BP (!) 140/78   Pulse 61   Ht 5' 10 (1.778 m)   Wt 253 lb (114.8 kg)   SpO2 96%   BMI 36.30 kg/m    Wt Readings from Last 3 Encounters:  11/03/23 253 lb (114.8 kg)  09/02/23 250 lb 9.6 oz (113.7 kg)  01/10/23 252 lb (114.3 kg)     GEN: Well nourished, well developed in no acute distress NECK: No JVD; No carotid bruits CARDIAC: RRR, no murmurs, rubs, gallops RESPIRATORY:  Clear to auscultation without rales, wheezing or rhonchi  ABDOMEN: Soft, non-tender, non-distended EXTREMITIES:  No edema; No deformity   ASSESSMENT AND PLAN:   Aortic Aneurysm:  His aorta was 43 mm on CT in August 2025.  I will follow  this up with another CT in August 2027.    HTN:  BP is not at target.  I will increase his Cozaar  to 100 mg daily and he will keep a blood pressure diary.     Hyperlipidemia:   I will increase the Crestor  to 20 mg PO daily and repeat a lipid profile in 3 months with a goal LDL of 55.    Obstructive Sleep Apnea:   He uses his CPAP.   Aortic Regurgitation:   He had an echo in 2020 with no significant AI.   This was trivial on echo in 2020.    SOB:   I suspect this may be weight and deconditioning based on the absence of other infective findings.  He is going to work on weight loss and continued exercise  CAD: He has nonobstructive CAD.  We are pursuing aggressive primary risk reduction.     Follow up with me in 2 years.  Signed, Lynwood Schilling, MD

## 2023-11-03 ENCOUNTER — Encounter: Payer: Self-pay | Admitting: Cardiology

## 2023-11-03 ENCOUNTER — Ambulatory Visit: Attending: Cardiology | Admitting: Cardiology

## 2023-11-03 VITALS — BP 140/78 | HR 61 | Ht 70.0 in | Wt 253.0 lb

## 2023-11-03 DIAGNOSIS — I77819 Aortic ectasia, unspecified site: Secondary | ICD-10-CM

## 2023-11-03 DIAGNOSIS — E785 Hyperlipidemia, unspecified: Secondary | ICD-10-CM

## 2023-11-03 DIAGNOSIS — I10 Essential (primary) hypertension: Secondary | ICD-10-CM

## 2023-11-03 DIAGNOSIS — R0602 Shortness of breath: Secondary | ICD-10-CM | POA: Diagnosis not present

## 2023-11-03 MED ORDER — ROSUVASTATIN CALCIUM 20 MG PO TABS: 20.0000 mg | ORAL_TABLET | Freq: Every day | ORAL | 3 refills | Status: AC

## 2023-11-03 MED ORDER — LOSARTAN POTASSIUM 100 MG PO TABS: 100.0000 mg | ORAL_TABLET | Freq: Every day | ORAL | 3 refills | Status: AC

## 2023-11-03 NOTE — Patient Instructions (Addendum)
 Medication Instructions:  Increase Cozaar  to 100 mg once daily Increase Crestor  to 20 mg once daily *If you need a refill on your cardiac medications before your next appointment, please call your pharmacy*  Lab Work: Fasting lipid panel in 3 months If you have labs (blood work) drawn today and your tests are completely normal, you will receive your results only by: MyChart Message (if you have MyChart) OR A paper copy in the mail If you have any lab test that is abnormal or we need to change your treatment, we will call you to review the results.  Testing/Procedures: CT of Aorta in 2027  Follow-Up: At [facility], you and your health needs are our priority.  As part of our continuing mission to provide you with exceptional heart care, our providers are all part of one team.  This team includes your primary Cardiologist (physician) and Advanced Practice Providers or APPs (Physician Assistants and Nurse Practitioners) who all work together to provide you with the care you need, when you need it.  Your next appointment:   In 2 years, after CT  Provider:   Lavona, MD  We recommend signing up for the patient portal called MyChart.  Sign up information is provided on this After Visit Summary.  MyChart is used to connect with patients for Virtual Visits (Telemedicine).  Patients are able to view lab/test results, encounter notes, upcoming appointments, etc.  Non-urgent messages can be sent to your provider as well.   To learn more about what you can do with MyChart, go to [email or web address].   Other Instructions Blood pressure diary: Take your blood pressure twice daily for 10 days

## 2023-11-07 DIAGNOSIS — D373 Neoplasm of uncertain behavior of appendix: Secondary | ICD-10-CM | POA: Diagnosis not present

## 2023-12-13 DIAGNOSIS — L578 Other skin changes due to chronic exposure to nonionizing radiation: Secondary | ICD-10-CM | POA: Diagnosis not present

## 2023-12-13 DIAGNOSIS — L821 Other seborrheic keratosis: Secondary | ICD-10-CM | POA: Diagnosis not present

## 2023-12-13 DIAGNOSIS — Z08 Encounter for follow-up examination after completed treatment for malignant neoplasm: Secondary | ICD-10-CM | POA: Diagnosis not present

## 2023-12-13 DIAGNOSIS — D485 Neoplasm of uncertain behavior of skin: Secondary | ICD-10-CM | POA: Diagnosis not present

## 2023-12-13 DIAGNOSIS — D1801 Hemangioma of skin and subcutaneous tissue: Secondary | ICD-10-CM | POA: Diagnosis not present

## 2023-12-13 DIAGNOSIS — Z85828 Personal history of other malignant neoplasm of skin: Secondary | ICD-10-CM | POA: Diagnosis not present

## 2023-12-13 DIAGNOSIS — L814 Other melanin hyperpigmentation: Secondary | ICD-10-CM | POA: Diagnosis not present

## 2024-01-19 ENCOUNTER — Other Ambulatory Visit: Payer: Self-pay | Admitting: Cardiology

## 2024-03-12 LAB — LAB REPORT - SCANNED
A1c: 6.2
EGFR: 94

## 2024-03-15 ENCOUNTER — Ambulatory Visit: Payer: Self-pay | Admitting: Cardiology
